# Patient Record
Sex: Female | Born: 1965 | Race: Black or African American | Hispanic: No | Marital: Married | Smoking: Never smoker
Health system: Southern US, Community
[De-identification: ages and names within clinical notes are randomized; demographics above are authoritative.]

## PROBLEM LIST (undated history)

## (undated) DIAGNOSIS — D219 Benign neoplasm of connective and other soft tissue, unspecified: Secondary | ICD-10-CM

## (undated) DIAGNOSIS — E119 Type 2 diabetes mellitus without complications: Secondary | ICD-10-CM

## (undated) DIAGNOSIS — D649 Anemia, unspecified: Secondary | ICD-10-CM

## (undated) DIAGNOSIS — M1990 Unspecified osteoarthritis, unspecified site: Secondary | ICD-10-CM

## (undated) DIAGNOSIS — E669 Obesity, unspecified: Secondary | ICD-10-CM

## (undated) HISTORY — PX: CHOLECYSTECTOMY: SHX55

## (undated) HISTORY — PX: TUBAL LIGATION: SHX77

## (undated) HISTORY — PX: WISDOM TOOTH EXTRACTION: SHX21

## (undated) HISTORY — DX: Obesity, unspecified: E66.9

---

## 1997-07-17 ENCOUNTER — Ambulatory Visit (HOSPITAL_COMMUNITY): Admission: RE | Admit: 1997-07-17 | Discharge: 1997-07-17 | Payer: Self-pay | Admitting: Family Medicine

## 1998-10-02 ENCOUNTER — Emergency Department (HOSPITAL_COMMUNITY): Admission: EM | Admit: 1998-10-02 | Discharge: 1998-10-02 | Payer: Self-pay | Admitting: Emergency Medicine

## 1998-10-02 ENCOUNTER — Encounter: Payer: Self-pay | Admitting: Emergency Medicine

## 1999-09-05 ENCOUNTER — Encounter: Payer: Self-pay | Admitting: *Deleted

## 1999-09-05 ENCOUNTER — Encounter: Admission: RE | Admit: 1999-09-05 | Discharge: 1999-09-05 | Payer: Self-pay | Admitting: *Deleted

## 2000-01-01 ENCOUNTER — Encounter: Payer: Self-pay | Admitting: *Deleted

## 2000-01-01 ENCOUNTER — Ambulatory Visit (HOSPITAL_COMMUNITY): Admission: RE | Admit: 2000-01-01 | Discharge: 2000-01-01 | Payer: Self-pay | Admitting: *Deleted

## 2000-05-12 ENCOUNTER — Inpatient Hospital Stay: Admission: AD | Admit: 2000-05-12 | Discharge: 2000-05-12 | Payer: Self-pay | Admitting: *Deleted

## 2000-05-13 ENCOUNTER — Inpatient Hospital Stay (HOSPITAL_COMMUNITY): Admission: AD | Admit: 2000-05-13 | Discharge: 2000-05-16 | Payer: Self-pay | Admitting: *Deleted

## 2000-05-13 ENCOUNTER — Encounter (INDEPENDENT_AMBULATORY_CARE_PROVIDER_SITE_OTHER): Payer: Self-pay | Admitting: Specialist

## 2000-05-18 ENCOUNTER — Observation Stay (HOSPITAL_COMMUNITY): Admission: AD | Admit: 2000-05-18 | Discharge: 2000-05-19 | Payer: Self-pay | Admitting: *Deleted

## 2002-07-12 ENCOUNTER — Other Ambulatory Visit: Admission: RE | Admit: 2002-07-12 | Discharge: 2002-07-12 | Payer: Self-pay | Admitting: Obstetrics and Gynecology

## 2004-08-22 ENCOUNTER — Other Ambulatory Visit: Admission: RE | Admit: 2004-08-22 | Discharge: 2004-08-22 | Payer: Self-pay | Admitting: Obstetrics and Gynecology

## 2005-10-20 ENCOUNTER — Other Ambulatory Visit: Admission: RE | Admit: 2005-10-20 | Discharge: 2005-10-20 | Payer: Self-pay | Admitting: Obstetrics and Gynecology

## 2006-03-04 ENCOUNTER — Ambulatory Visit: Payer: Self-pay | Admitting: Family Medicine

## 2006-06-16 ENCOUNTER — Ambulatory Visit: Payer: Self-pay | Admitting: Family Medicine

## 2006-06-17 ENCOUNTER — Ambulatory Visit: Payer: Self-pay | Admitting: Family Medicine

## 2006-08-18 ENCOUNTER — Ambulatory Visit: Payer: Self-pay | Admitting: Family Medicine

## 2006-08-19 ENCOUNTER — Ambulatory Visit (HOSPITAL_COMMUNITY): Admission: RE | Admit: 2006-08-19 | Discharge: 2006-08-19 | Payer: Self-pay | Admitting: Family Medicine

## 2006-09-13 ENCOUNTER — Ambulatory Visit: Payer: Self-pay | Admitting: Family Medicine

## 2006-11-27 DIAGNOSIS — Z9189 Other specified personal risk factors, not elsewhere classified: Secondary | ICD-10-CM | POA: Insufficient documentation

## 2006-11-27 DIAGNOSIS — Z87448 Personal history of other diseases of urinary system: Secondary | ICD-10-CM

## 2006-11-27 HISTORY — DX: Other specified personal risk factors, not elsewhere classified: Z91.89

## 2006-12-07 ENCOUNTER — Ambulatory Visit: Payer: Self-pay | Admitting: Family Medicine

## 2007-02-10 ENCOUNTER — Ambulatory Visit: Payer: Self-pay | Admitting: Family Medicine

## 2007-02-10 ENCOUNTER — Ambulatory Visit: Payer: Self-pay | Admitting: *Deleted

## 2007-02-10 DIAGNOSIS — R300 Dysuria: Secondary | ICD-10-CM | POA: Insufficient documentation

## 2007-02-10 HISTORY — DX: Dysuria: R30.0

## 2007-02-10 LAB — CONVERTED CEMR LAB
Bilirubin Urine: NEGATIVE
Blood in Urine, dipstick: NEGATIVE
Glucose, Urine, Semiquant: NEGATIVE
Protein, U semiquant: NEGATIVE

## 2007-02-11 ENCOUNTER — Encounter (INDEPENDENT_AMBULATORY_CARE_PROVIDER_SITE_OTHER): Payer: Self-pay | Admitting: Nurse Practitioner

## 2007-02-28 ENCOUNTER — Ambulatory Visit: Payer: Self-pay | Admitting: Family Medicine

## 2007-03-01 ENCOUNTER — Encounter (INDEPENDENT_AMBULATORY_CARE_PROVIDER_SITE_OTHER): Payer: Self-pay | Admitting: Family Medicine

## 2007-03-01 ENCOUNTER — Ambulatory Visit: Payer: Self-pay | Admitting: Family Medicine

## 2007-05-23 ENCOUNTER — Ambulatory Visit: Payer: Self-pay | Admitting: Family Medicine

## 2007-07-05 ENCOUNTER — Ambulatory Visit: Payer: Self-pay | Admitting: Family Medicine

## 2007-07-05 DIAGNOSIS — S93409A Sprain of unspecified ligament of unspecified ankle, initial encounter: Secondary | ICD-10-CM

## 2007-07-05 DIAGNOSIS — M25569 Pain in unspecified knee: Secondary | ICD-10-CM

## 2007-07-05 HISTORY — DX: Sprain of unspecified ligament of unspecified ankle, initial encounter: S93.409A

## 2007-07-05 HISTORY — DX: Pain in unspecified knee: M25.569

## 2007-07-18 ENCOUNTER — Telehealth (INDEPENDENT_AMBULATORY_CARE_PROVIDER_SITE_OTHER): Payer: Self-pay | Admitting: *Deleted

## 2007-07-26 ENCOUNTER — Telehealth (INDEPENDENT_AMBULATORY_CARE_PROVIDER_SITE_OTHER): Payer: Self-pay | Admitting: *Deleted

## 2007-07-26 ENCOUNTER — Ambulatory Visit (HOSPITAL_COMMUNITY): Admission: RE | Admit: 2007-07-26 | Discharge: 2007-07-26 | Payer: Self-pay | Admitting: Family Medicine

## 2007-08-01 ENCOUNTER — Telehealth (INDEPENDENT_AMBULATORY_CARE_PROVIDER_SITE_OTHER): Payer: Self-pay | Admitting: *Deleted

## 2007-08-02 ENCOUNTER — Ambulatory Visit (HOSPITAL_COMMUNITY): Admission: RE | Admit: 2007-08-02 | Discharge: 2007-08-02 | Payer: Self-pay | Admitting: Family Medicine

## 2007-08-10 ENCOUNTER — Ambulatory Visit: Payer: Self-pay | Admitting: Family Medicine

## 2007-08-10 LAB — CONVERTED CEMR LAB
Bilirubin Urine: NEGATIVE
Blood in Urine, dipstick: NEGATIVE
Glucose, Urine, Semiquant: NEGATIVE
Protein, U semiquant: NEGATIVE

## 2007-08-15 ENCOUNTER — Ambulatory Visit: Payer: Self-pay | Admitting: Family Medicine

## 2007-11-07 ENCOUNTER — Ambulatory Visit: Payer: Self-pay | Admitting: Family Medicine

## 2007-11-07 LAB — CONVERTED CEMR LAB: Beta hcg, urine, semiquantitative: NEGATIVE

## 2008-01-27 ENCOUNTER — Ambulatory Visit: Payer: Self-pay | Admitting: Family Medicine

## 2008-03-20 ENCOUNTER — Ambulatory Visit: Payer: Self-pay | Admitting: Family Medicine

## 2008-03-20 DIAGNOSIS — M5380 Other specified dorsopathies, site unspecified: Secondary | ICD-10-CM

## 2008-03-20 HISTORY — DX: Other specified dorsopathies, site unspecified: M53.80

## 2008-04-20 ENCOUNTER — Ambulatory Visit: Payer: Self-pay | Admitting: Family Medicine

## 2008-05-28 ENCOUNTER — Encounter (INDEPENDENT_AMBULATORY_CARE_PROVIDER_SITE_OTHER): Payer: Self-pay | Admitting: Family Medicine

## 2008-05-28 ENCOUNTER — Ambulatory Visit: Payer: Self-pay | Admitting: Family Medicine

## 2008-05-28 LAB — CONVERTED CEMR LAB
ALT: 17 units/L (ref 0–35)
AST: 14 units/L (ref 0–37)
Blood in Urine, dipstick: NEGATIVE
Chloride: 108 meq/L (ref 96–112)
Creatinine, Ser: 0.69 mg/dL (ref 0.40–1.20)
Eosinophils Absolute: 0.1 10*3/uL (ref 0.0–0.7)
Glucose, Urine, Semiquant: NEGATIVE
HCT: 38.9 % (ref 36.0–46.0)
Ketones, urine, test strip: NEGATIVE
Lymphocytes Relative: 41 % (ref 12–46)
Lymphs Abs: 2.5 10*3/uL (ref 0.7–4.0)
MCV: 88.6 fL (ref 78.0–100.0)
Monocytes Relative: 7 % (ref 3–12)
Neutrophils Relative %: 50 % (ref 43–77)
Potassium: 4.5 meq/L (ref 3.5–5.3)
RBC: 4.39 M/uL (ref 3.87–5.11)
TSH: 0.803 microintl units/mL (ref 0.350–4.50)
Total Bilirubin: 0.5 mg/dL (ref 0.3–1.2)
Total CHOL/HDL Ratio: 3.1
Total Protein: 7.3 g/dL (ref 6.0–8.3)
VLDL: 16 mg/dL (ref 0–40)
WBC Urine, dipstick: NEGATIVE
WBC: 6 10*3/uL (ref 4.0–10.5)

## 2008-06-01 ENCOUNTER — Ambulatory Visit (HOSPITAL_COMMUNITY): Admission: RE | Admit: 2008-06-01 | Discharge: 2008-06-01 | Payer: Self-pay | Admitting: Family Medicine

## 2008-06-15 ENCOUNTER — Encounter (INDEPENDENT_AMBULATORY_CARE_PROVIDER_SITE_OTHER): Payer: Self-pay | Admitting: *Deleted

## 2008-07-05 ENCOUNTER — Telehealth (INDEPENDENT_AMBULATORY_CARE_PROVIDER_SITE_OTHER): Payer: Self-pay | Admitting: *Deleted

## 2008-07-13 ENCOUNTER — Ambulatory Visit: Payer: Self-pay | Admitting: Family Medicine

## 2008-10-03 ENCOUNTER — Telehealth (INDEPENDENT_AMBULATORY_CARE_PROVIDER_SITE_OTHER): Payer: Self-pay | Admitting: Family Medicine

## 2008-10-09 ENCOUNTER — Ambulatory Visit: Payer: Self-pay | Admitting: Physician Assistant

## 2008-10-09 LAB — CONVERTED CEMR LAB: Chlamydia, DNA Probe: NEGATIVE

## 2008-10-10 ENCOUNTER — Encounter: Payer: Self-pay | Admitting: Physician Assistant

## 2008-11-08 ENCOUNTER — Telehealth: Payer: Self-pay | Admitting: Physician Assistant

## 2008-11-13 ENCOUNTER — Ambulatory Visit: Payer: Self-pay | Admitting: Physician Assistant

## 2008-11-13 DIAGNOSIS — N61 Mastitis without abscess: Secondary | ICD-10-CM | POA: Insufficient documentation

## 2008-11-13 HISTORY — DX: Mastitis without abscess: N61.0

## 2008-11-14 ENCOUNTER — Encounter: Admission: RE | Admit: 2008-11-14 | Discharge: 2008-11-14 | Payer: Self-pay | Admitting: Internal Medicine

## 2008-12-26 ENCOUNTER — Ambulatory Visit: Payer: Self-pay | Admitting: Obstetrics and Gynecology

## 2008-12-27 ENCOUNTER — Encounter: Payer: Self-pay | Admitting: Physician Assistant

## 2008-12-28 ENCOUNTER — Ambulatory Visit (HOSPITAL_COMMUNITY): Admission: RE | Admit: 2008-12-28 | Discharge: 2008-12-28 | Payer: Self-pay | Admitting: Obstetrics and Gynecology

## 2009-01-16 ENCOUNTER — Ambulatory Visit: Payer: Self-pay | Admitting: Obstetrics and Gynecology

## 2009-01-24 ENCOUNTER — Ambulatory Visit: Payer: Self-pay | Admitting: Nurse Practitioner

## 2009-01-24 ENCOUNTER — Encounter: Payer: Self-pay | Admitting: Physician Assistant

## 2009-01-25 ENCOUNTER — Ambulatory Visit: Payer: Self-pay | Admitting: Physician Assistant

## 2009-02-01 ENCOUNTER — Telehealth: Payer: Self-pay | Admitting: Physician Assistant

## 2009-02-07 ENCOUNTER — Encounter: Payer: Self-pay | Admitting: Physician Assistant

## 2009-02-07 DIAGNOSIS — N920 Excessive and frequent menstruation with regular cycle: Secondary | ICD-10-CM | POA: Insufficient documentation

## 2009-02-15 ENCOUNTER — Telehealth: Payer: Self-pay | Admitting: Physician Assistant

## 2009-04-18 ENCOUNTER — Ambulatory Visit: Payer: Self-pay | Admitting: Physician Assistant

## 2009-05-01 ENCOUNTER — Ambulatory Visit: Payer: Self-pay | Admitting: Physician Assistant

## 2009-05-01 LAB — CONVERTED CEMR LAB
Blood in Urine, dipstick: NEGATIVE
Ketones, urine, test strip: NEGATIVE
Nitrite: NEGATIVE
Protein, U semiquant: NEGATIVE
Urobilinogen, UA: 0.2

## 2009-05-02 ENCOUNTER — Encounter: Payer: Self-pay | Admitting: Physician Assistant

## 2009-05-02 LAB — CONVERTED CEMR LAB: Chlamydia, Swab/Urine, PCR: NEGATIVE

## 2009-05-03 ENCOUNTER — Encounter (INDEPENDENT_AMBULATORY_CARE_PROVIDER_SITE_OTHER): Payer: Self-pay | Admitting: *Deleted

## 2009-05-28 ENCOUNTER — Ambulatory Visit: Payer: Self-pay | Admitting: Physician Assistant

## 2009-05-28 LAB — CONVERTED CEMR LAB
AST: 12 units/L (ref 0–37)
Albumin: 4.5 g/dL (ref 3.5–5.2)
BUN: 11 mg/dL (ref 6–23)
Bilirubin Urine: NEGATIVE
Calcium: 9.5 mg/dL (ref 8.4–10.5)
Chloride: 107 meq/L (ref 96–112)
HDL: 45 mg/dL (ref >39)
Ketones, urine, test strip: NEGATIVE
Potassium: 4.3 meq/L (ref 3.5–5.3)
Urobilinogen, UA: 0.2
pH: 5.5

## 2009-05-29 ENCOUNTER — Encounter: Payer: Self-pay | Admitting: Physician Assistant

## 2009-06-06 ENCOUNTER — Telehealth: Payer: Self-pay | Admitting: Physician Assistant

## 2009-06-07 ENCOUNTER — Encounter: Admission: RE | Admit: 2009-06-07 | Discharge: 2009-06-07 | Payer: Self-pay | Admitting: Internal Medicine

## 2009-07-10 ENCOUNTER — Ambulatory Visit: Payer: Self-pay | Admitting: Internal Medicine

## 2009-07-24 ENCOUNTER — Ambulatory Visit: Payer: Self-pay | Admitting: Physician Assistant

## 2009-07-24 DIAGNOSIS — N390 Urinary tract infection, site not specified: Secondary | ICD-10-CM

## 2009-07-24 HISTORY — DX: Urinary tract infection, site not specified: N39.0

## 2009-07-24 LAB — CONVERTED CEMR LAB
Glucose, Urine, Semiquant: NEGATIVE
Ketones, urine, test strip: NEGATIVE
Nitrite: POSITIVE

## 2009-07-25 ENCOUNTER — Encounter: Payer: Self-pay | Admitting: Physician Assistant

## 2009-09-30 ENCOUNTER — Telehealth: Payer: Self-pay | Admitting: Physician Assistant

## 2009-10-01 ENCOUNTER — Ambulatory Visit: Payer: Self-pay | Admitting: Physician Assistant

## 2009-12-23 ENCOUNTER — Telehealth: Payer: Self-pay | Admitting: Physician Assistant

## 2009-12-23 ENCOUNTER — Encounter: Payer: Self-pay | Admitting: Physician Assistant

## 2010-02-13 ENCOUNTER — Inpatient Hospital Stay (HOSPITAL_COMMUNITY)
Admission: AD | Admit: 2010-02-13 | Discharge: 2010-02-13 | Payer: Self-pay | Source: Home / Self Care | Admitting: Obstetrics & Gynecology

## 2010-02-14 ENCOUNTER — Emergency Department (HOSPITAL_COMMUNITY)
Admission: EM | Admit: 2010-02-14 | Discharge: 2010-02-14 | Payer: Self-pay | Source: Home / Self Care | Admitting: Family Medicine

## 2010-02-14 ENCOUNTER — Telehealth (INDEPENDENT_AMBULATORY_CARE_PROVIDER_SITE_OTHER): Payer: Self-pay | Admitting: Nurse Practitioner

## 2010-02-20 ENCOUNTER — Emergency Department (HOSPITAL_COMMUNITY)
Admission: EM | Admit: 2010-02-20 | Discharge: 2010-02-20 | Payer: Self-pay | Source: Home / Self Care | Admitting: Family Medicine

## 2010-03-12 ENCOUNTER — Ambulatory Visit: Payer: Self-pay | Admitting: Internal Medicine

## 2010-03-12 DIAGNOSIS — H8309 Labyrinthitis, unspecified ear: Secondary | ICD-10-CM

## 2010-03-12 DIAGNOSIS — J019 Acute sinusitis, unspecified: Secondary | ICD-10-CM

## 2010-03-12 HISTORY — DX: Acute sinusitis, unspecified: J01.90

## 2010-03-12 HISTORY — DX: Labyrinthitis, unspecified ear: H83.09

## 2010-03-12 LAB — CONVERTED CEMR LAB: Blood Glucose, Fingerstick: 80

## 2010-04-02 ENCOUNTER — Telehealth (INDEPENDENT_AMBULATORY_CARE_PROVIDER_SITE_OTHER): Payer: Self-pay | Admitting: Internal Medicine

## 2010-04-29 ENCOUNTER — Telehealth (INDEPENDENT_AMBULATORY_CARE_PROVIDER_SITE_OTHER): Payer: Self-pay | Admitting: Internal Medicine

## 2010-05-08 NOTE — Assessment & Plan Note (Signed)
Summary: depo injection//gk  Nurse Visit   Allergies: No Known Drug Allergies  Medication Administration  Injection # 1:    Medication: Depo-Provera 150mg     Diagnosis: CONTRACEPTIVE MANAGEMENT (ICD-V25.09)    Route: IM    Site: R deltoid    Comments: Next Depo due 12/23/09    Patient tolerated injection without complications    Given by: Gaylyn Cheers RN (October 01, 2009 11:47 AM)  Orders Added: 1)  Depo-Provera 150mg  [J1055] 2)  Admin of Therapeutic Inj  intramuscular or subcutaneous [96372] 3)  Est. Patient Level I [16109]   Medication Administration  Injection # 1:    Medication: Depo-Provera 150mg     Diagnosis: CONTRACEPTIVE MANAGEMENT (ICD-V25.09)    Route: IM    Site: R deltoid    Comments: Next Depo due 12/23/09    Patient tolerated injection without complications    Given by: Gaylyn Cheers RN (October 01, 2009 11:47 AM)  Orders Added: 1)  Depo-Provera 150mg  [J1055] 2)  Admin of Therapeutic Inj  intramuscular or subcutaneous [96372] 3)  Est. Patient Level I [60454]

## 2010-05-08 NOTE — Assessment & Plan Note (Signed)
Summary: CPP EXAM//GK   Vital Signs:  Patient profile:   45 year old female Menstrual status:  irregular Height:      62 inches Weight:      228 pounds BMI:     41.85 Temp:     98.3 degrees F oral Pulse rate:   74 / minute Pulse rhythm:   regular Resp:     18 per minute BP sitting:   130 / 83  (left arm) Cuff size:   large  Vitals Entered By: Armenia Shannon (May 28, 2009 10:34 AM) CC: CPP.... Is Patient Diabetic? No Pain Assessment Patient in pain? no       Does patient need assistance? Functional Status Self care Ambulation Normal   CC:  CPP.....  History of Present Illness: Here for CPP. Never had abnormal pap. Notes some spotting today.  Still taking Depo and due for f/u injection in April. Actually, in talking with patient, she is actually bleeding like she is having a full period. She has a long h/o R pelvic pain with ovulation and menses.  Did see gyn clinic in 12/2008 and ultrasound was notable for a small fibroid and normal ovaries.  She takes ibuprofen for the pain and sometimes flexeril.  She discussed with gyn possibly getting an IUD placed as she is a little skeptical of continuing the depo-provera. No discharge, odor or itching. Had a cyst like lesion in 11/2008 and had mammo and u/s.  F/u screening mammo rec. this mo. to get back on yearly schedule. No fhx of breast cancer or ovarian cancer. Pt. not taking calcium.   Problems Prior to Update: 1)  Menorrhagia  (ICD-626.2) 2)  Contraceptive Management  (ICD-V25.09) 3)  Abscess, Breast, Right  (ICD-611.0) 4)  Screening For Mlig Neop, Breast, Nos  (ICD-V76.10) 5)  Screening For Malignant Neoplasm, Cervix  (ICD-V76.2) 6)  Examination, Routine Medical  (ICD-V70.0) 7)  Muscle Spasm, Back  (ICD-724.8) 8)  Ankle Sprain, Left  (ICD-845.00) 9)  Knee Pain, Left  (ICD-719.46) 10)  Contraceptive Management  (ICD-V25.09) 11)  Dysuria  (ICD-788.1) 12)  Abdominal Pain, Hx of  (ICD-V15.89) 13)  Uti's, Hx of   (ICD-V13.00)  Current Medications (verified): 1)  Depo-Provera 150 Mg/ml Im Susp (Medroxyprogesterone Acetate) .... Im Every 3mos 2)  Ibuprofen 800 Mg Tabs (Ibuprofen) .... Take One Tablet By Mouth Every 6-8 Hours As Needed For Pain. Take With Food 3)  Flexeril 10 Mg Tabs (Cyclobenzaprine Hcl) .... Take 1 Tablet By Mouth Every 8 Hours As Needed Muscle Pain 4)  Naprosyn 500 Mg Tabs (Naproxen) .... Take 1 Tablet By Mouth Two Times A Day As Needed For Pain 5)  Diflucan 150 Mg Tabs (Fluconazole) .Marland Kitchen.. 1 By Mouth X 1  Allergies (verified): No Known Drug Allergies  Past History:  Past Medical History: Last updated: 07/05/2007 H/O SMALL FIBROID ABDOMINAL PAIN, HX OF (ICD-V15.89) UTI'S, HX OF (ICD-V13.00)  Past Surgical History: Last updated: 11/27/2006 Caesarean section x3 Tubal ligation 2002  Family History: Reviewed history from 05/28/2008 and no changes required. Mother living thyroid,DM,HTN,OA,glaucoma,RA Father living DM   No family h/o breast/cervical/ovarian/uterine/colon cancer.  Social History: Reviewed history from 05/28/2008 and no changes required. Occupation:childcare Married Never Smoked Alcohol use-yes,rarely. Drug use-no  Review of Systems  The patient denies fever, weight loss, chest pain, syncope, dyspnea on exertion, prolonged cough, hemoptysis, melena, hematochezia, severe indigestion/heartburn, hematuria, and depression.         See HPI.  All other systems reviewed and neg.  Physical Exam  General:  alert, well-developed, and well-nourished.   Head:  atraumatic.   Eyes:  pupils equal, pupils round, pupils reactive to light, and no injection.   Ears:  R ear normal and L ear normal.   Nose:  no external deformity.   Mouth:  pharynx pink and moist, no erythema, and no exudates.   Neck:  supple, no thyromegaly, no carotid bruits, and no cervical lymphadenopathy.   Breasts:  skin/areolae normal, no masses, no abnormal thickening, no nipple discharge, no  tenderness, and no adenopathy.   Lungs:  normal breath sounds, no crackles, and no wheezes.   Heart:  normal rate, regular rhythm, and no murmur.   Abdomen:  soft, non-tender, normal bowel sounds, and no hepatomegaly.   Rectal:  deferred Genitalia:  normal introitus, no external lesions, and mucosa pink and moist.   sanguinous material noted coming from cervical os no lesions or external bleeding  Msk:  normal ROM.   Pulses:  DP/PT 2+ bilat Extremities:  no edema  Neurologic:  alert & oriented X3 and cranial nerves II-XII intact.   Skin:  turgor normal.   Psych:  normally interactive.     Impression & Recommendations:  Problem # 1:  EXAMINATION, ROUTINE MEDICAL (ICD-V70.0) patient refuses flu shot had Td PHQ9=1   Orders: T-Urinalysis (47829-56213) T-HIV Antibody  (Reflex) [id number]) T-Syphilis Test (RPR) [phone]) T-Comprehensive Metabolic Panel ([phone]) T-Lipid Profile ([id number])  Problem # 2:  SCREENING FOR MLIG NEOP, BREAST, NOS (ICD-V76.10)  Orders: Mammogram (Screening) (Mammo)  Problem # 3:  SCREENING FOR MALIGNANT NEOPLASM, CERVIX (ICD-V76.2) actually having a period today will bring back for pap smear  Orders: T-HIV Antibody  (Reflex) ([id number]) T-Syphilis Test (RPR) (32951-88416)  Problem # 4:  MENORRHAGIA (ICD-626.2) continues on depo but would like to try the IUD when she can afford it suspect some breakthrough bleeding from depo today   Her updated medication list for this problem includes:    Depo-provera 150 Mg/ml Im Susp (Medroxyprogesterone acetate) ..... Im every 3mos  Problem # 5:  DYSURIA (ICD-788.1) resolved with antibx therapy of note, her cultrue was neg no further w/u for now  blood noted on u/a today 2/2 menses  Complete Medication List: 1)  Depo-provera 150 Mg/ml Im Susp (Medroxyprogesterone acetate) .... Im every 3mos 2)  Ibuprofen 800 Mg Tabs (Ibuprofen) .... Take one tablet by mouth every 6-8 hours as needed  for pain. take with food 3)  Flexeril 10 Mg Tabs (Cyclobenzaprine hcl) .... Take 1 tablet by mouth every 8 hours as needed muscle pain 4)  Naprosyn 500 Mg Tabs (Naproxen) .... Take 1 tablet by mouth two times a day as needed for pain 5)  Diflucan 150 Mg Tabs (Fluconazole) .Marland Kitchen.. 1 by mouth x 1  Patient Instructions: 1)  Schedule pap smear in 2 weeks with Kiasha Bellin.   2)  Return sooner or go to the Emergency Room if your bleeding persisits or gets worse. 3)  Take 400-600 mg of Ibuprofen (Advil, Motrin) with food every 4-6 hours as needed  for relief of pain or comfort of fever.   Laboratory Results   Urine Tests  Date/Time Received: May 28, 2009 11:02 AM   Routine Urinalysis   Glucose: negative   (Normal Range: Negative) Bilirubin: negative   (Normal Range: Negative) Ketone: negative   (Normal Range: Negative) Spec. Gravity: 1.015   (Normal Range: 1.003-1.035) Blood: large   (Normal Range: Negative) pH: 5.5   (Normal Range: 5.0-8.0) Protein: trace   (Normal Range: Negative) Urobilinogen:  0.2   (Normal Range: 0-1) Nitrite: negative   (Normal Range: Negative) Leukocyte Esterace: negative   (Normal Range: Negative)    Date/Time Received: May 28, 2009 12:45 PM  Date/Time Reported: May 28, 2009 12:45 PM   Other Tests  Rapid HIV: negative   Laboratory Results   Urine Tests    Routine Urinalysis   Glucose: negative   (Normal Range: Negative) Bilirubin: negative   (Normal Range: Negative) Ketone: negative   (Normal Range: Negative) Spec. Gravity: 1.015   (Normal Range: 1.003-1.035) Blood: large   (Normal Range: Negative) pH: 5.5   (Normal Range: 5.0-8.0) Protein: trace   (Normal Range: Negative) Urobilinogen: 0.2   (Normal Range: 0-1) Nitrite: negative   (Normal Range: Negative) Leukocyte Esterace: negative   (Normal Range: Negative)      Other Tests  Rapid HIV: negative

## 2010-05-08 NOTE — Letter (Signed)
Summary: *HSN Results Follow up  [facility]  [address] [place], [place] [address]   Phone: [phone]  Fax: [phone]      05/29/2009   SKYE PLAMONDON Hauth [address] [place], [place]  [address]   Dear  Ms. Rachella Boyar,                            ____S.Drinkard,FNP   ____D. Gore,FNP       ____B. McPherson,MD   ____V. Rankins,MD    ____E. Mulberry,MD    ____N. Daphine Deutscher, FNP  ____D. Reche Dixon, MD    ____K. Philipp Deputy, MD    __x__S. Alben Spittle, PA-C     This letter is to inform you that your recent test(s):  _______Pap Smear    ___x____Lab Test     _______X-ray    ___x____ is within acceptable limits  _______ requires a medication change  _______ requires a follow-up lab visit  _______ requires a follow-up visit with your provider   Comments:       _________________________________________________________ If you have any questions, please contact our office                     Sincerely,  Tereso Newcomer PA-C [facility]

## 2010-05-08 NOTE — Assessment & Plan Note (Signed)
Summary: Patient Not Seen   Allergies: No Known Drug Allergies   Complete Medication List: 1)  Depo-provera 150 Mg/ml Im Susp (Medroxyprogesterone acetate) .... Im every 3mos 2)  Ibuprofen 800 Mg Tabs (Ibuprofen) .... Take one tablet by mouth every 6-8 hours as needed for pain. take with food 3)  Flexeril 10 Mg Tabs (Cyclobenzaprine hcl) .... Take 1 tablet by mouth every 8 hours as needed muscle pain 4)  Naprosyn 500 Mg Tabs (Naproxen) .... Take 1 tablet by mouth two times a day as needed for pain 5)  Diflucan 150 Mg Tabs (Fluconazole) .Marland Kitchen.. 1 by mouth x 1

## 2010-05-08 NOTE — Progress Notes (Signed)
Summary: DEPO RX  Phone Note Call from Patient   Caller: Patient Reason for Call: Refill Medication Summary of Call: PT NEEDS A RX FOR DEPO SEND TO [facility] Initial call taken by: Oscar La,  December 23, 2009 2:45 PM  Follow-up for Phone Call        forward to provider Follow-up by: Armenia Shannon,  December 23, 2009 3:46 PM  Additional Follow-up for Phone Call Additional follow up Details #1::        pt aware Additional Follow-up by: Armenia Shannon,  December 24, 2009 2:22 PM    Prescriptions: DEPO-PROVERA 150 MG/ML IM SUSP (MEDROXYPROGESTERONE ACETATE) IM every 3mos  #1 dose x 0   Entered and Authorized by:   Tereso Newcomer PA-C   Signed by:   Tereso Newcomer PA-C on 12/23/2009   Method used:   Faxed to ...       [facility] (retail)       [address] [place], [place]  [address]       Ph: [phone]       Fax: [phone]   RxID:   [id number]

## 2010-05-08 NOTE — Assessment & Plan Note (Signed)
Summary: //MC   Vital Signs:  Patient profile:   45 year old female Menstrual status:  Depo Weight:      235.13 pounds Temp:     98.5 degrees F oral Pulse rate:   78 / minute Pulse rhythm:   regular Resp:     18 per minute BP sitting:   128 / 96  (left arm) Cuff size:   regular  Vitals Entered By: Hale Drone CMA (March 12, 2010 12:35 PM) CC: [facility] urgent care f/u on vertigo. Pt. felt light headed, had blurry vsion, and felt nauseas. Pt had missed her depo w/us but was given the depo shot on 02/13/10.  Is Patient Diabetic? No Pain Assessment Patient in pain? no      CBG Result 80 CBG Device ID B Non Fasting  Does patient need assistance? Functional Status Self care Ambulation Normal LMP - Character: depo     Menstrual Status Depo Last PAP Result NEGATIVE FOR INTRAEPITHELIAL LESIONS OR MALIGNANCY.   Primary Care Provider:  Tereso Newcomer PA-C  CC:  [facility] urgent care f/u on vertigo. Pt. felt light headed, had blurry vsion, and and felt nauseas. Pt had missed her depo w/us but was given the depo shot on 02/13/10. Marland Kitchen  History of Present Illness: 1.  Vertigo: Started 02/13/10.  Noted after waking that morning.  Was seen 11/11 at [facility].   Went back on the 17th and was apparently doing better, but still with enough symptoms that felt needed to be seen.  Pt. later states that went to [facility] on the 10th as she thought her dizziness was related to her fibroids.  They apparently did blood work there that was normal.  No other testing done otherwise.  Pt. states she is doing much better now, but still "hazy" at this point.  Has some discomfort in nuchal area--when rubs there, gets swimmy headed.  Eyes feel heavy, sore and tired.  Has had some nasal congestion in last couple of days.  Has had a bit of a sore throat as well.  Ears have been buzzing, but no pain or discomfort.  Still nauseated, especially when looking in any direction or bending over.  No history of  allergies.  Has never had a sinus infection.  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  Current Medications (verified): 1)  Depo-Provera 150 Mg/ml Im Susp (Medroxyprogesterone Acetate) .... Im Every 3mos 2)  Ibuprofen 800 Mg Tabs (Ibuprofen) .... Take One Tablet By Mouth Every 6-8 Hours As Needed For Pain. Take With Food 3)  Flexeril 10 Mg Tabs (Cyclobenzaprine Hcl) .... Take 1 Tablet By Mouth Every 8 Hours As Needed Muscle Pain 4)  Naprosyn 500 Mg Tabs (Naproxen) .... Take 1 Tablet By Mouth Two Times A Day As Needed For Pain 5)  Diflucan 150 Mg Tabs (Fluconazole) .Marland Kitchen.. 1 By Mouth X 1  Allergies (verified): No Known Drug Allergies  Physical Exam  General:  NAD Head:  Tender over frontal and more so maxillary sinuses Eyes:  Eyelids puffy, no conjunctival injection.  EOMI, PERRL, no nystagmus with EOMI Ears:  External ear exam shows no significant lesions or deformities.  Otoscopic examination reveals clear canals, tympanic membranes are intact bilaterally without bulging, retraction, inflammation or discharge. Hearing is grossly normal bilaterally. Nose:  clear discharge mild turbinate swelling. Mouth:  pharynx pink and moist.   Neck:  No deformities, masses, or tenderness noted. Lungs:  Normal respiratory effort, chest  expands symmetrically. Lungs are clear to auscultation, no crackles or wheezes. Heart:  Normal rate and regular rhythm. S1 and S2 normal without gallop, murmur, click, rub or other extra sounds. Neurologic:  alert & oriented X3, cranial nerves II-XII intact, strength normal in all extremities, gait normal, DTRs symmetrical and normal, finger-to-nose normal, and Romberg negative.     Impression & Recommendations:  Problem # 1:  SINUSITIS, ACUTE (ICD-461.9) Also start Xyzal. Saline nasal spray Call if no improment. Her updated medication list for this problem includes:    Azithromycin 250 Mg Tabs (Azithromycin) .Marland Kitchen... 2 tabs by  mouth today, then 1 tab by mouth daily for 4 more days.  Problem # 2:  LABYRINTHITIS, ACUTE (ICD-386.30) Resolving.  Complete Medication List: 1)  Depo-provera 150 Mg/ml Im Susp (Medroxyprogesterone acetate) .... Im every 3mos 2)  Ibuprofen 800 Mg Tabs (Ibuprofen) .... Take one tablet by mouth every 6-8 hours as needed for pain. take with food 3)  Flexeril 10 Mg Tabs (Cyclobenzaprine hcl) .... Take 1 tablet by mouth every 8 hours as needed muscle pain 4)  Naprosyn 500 Mg Tabs (Naproxen) .... Take 1 tablet by mouth two times a day as needed for pain 5)  Diflucan 150 Mg Tabs (Fluconazole) .Marland Kitchen.. 1 by mouth x 1 6)  Azithromycin 250 Mg Tabs (Azithromycin) .... 2 tabs by mouth today, then 1 tab by mouth daily for 4 more days. 7)  Xyzal 5 Mg Tabs (Levocetirizine dihydrochloride) .Marland Kitchen.. 1 tab by mouth daily as needed for drainage  Other Orders: Flu Vaccine 37yrs + (60454) Admin 1st Vaccine (09811) Capillary Blood Glucose/CBG (91478)  Patient Instructions: 1)  Call if no improvement with treatment of sinuses. 2)  Saline nasal spray as needed Prescriptions: XYZAL 5 MG TABS (LEVOCETIRIZINE DIHYDROCHLORIDE) 1 tab by mouth daily as needed for drainage  #30 x 1   Entered and Authorized by:   Julieanne Manson MD   Signed by:   Julieanne Manson MD on 03/12/2010   Method used:   Faxed to ...       [facility] (retail)       [address] [place], [place]  [address]       Ph: [phone]       Fax: [phone]   RxID:   [id number] AZITHROMYCIN 250 MG TABS (AZITHROMYCIN) 2 tabs by mouth today, then 1 tab by mouth daily for 4 more days.  #6 x 0   Entered and Authorized by:   Julieanne Manson MD   Signed by:   Julieanne Manson MD on 03/12/2010   Method used:   Faxed to ...       [facility] (retail)       [address] [place], [place]  [address]       Ph: [phone]       Fax: [phone]    RxID:   [id number]    Orders Added: 1)  Flu Vaccine 31yrs + [90658] 2)  Admin 1st Vaccine [90471] 3)  Capillary Blood Glucose/CBG [82948] 4)  Est. Patient Level III [57322]   Immunizations Administered:  Influenza Vaccine # 1:    Vaccine Type: Fluvax 3+    Site: left deltoid    Mfr: GlaxoSmithKline    Dose: 0.5 ml    Route: IM    Given by: Hale Drone CMA    Exp. Date: 10/04/2010    Lot #:  [id number]    VIS given: 10/29/09 version given March 12, 2010.  Flu Vaccine Consent Questions:    Do you have a history of severe allergic reactions to this vaccine? no    Any prior history of allergic reactions to egg and/or gelatin? no    Do you have a sensitivity to the preservative Thimersol? no    Do you have a past history of Guillan-Barre Syndrome? no    Do you currently have an acute febrile illness? no    Have you ever had a severe reaction to latex? no    Vaccine information given and explained to patient? yes    Are you currently pregnant? no   Immunizations Administered:  Influenza Vaccine # 1:    Vaccine Type: Fluvax 3+    Site: left deltoid    Mfr: GlaxoSmithKline    Dose: 0.5 ml    Route: IM    Given by: Hale Drone CMA    Exp. Date: 10/04/2010    Lot #: [id number]    VIS given: 10/29/09 version given March 12, 2010.

## 2010-05-08 NOTE — Assessment & Plan Note (Signed)
Summary: BURNS WHEN URINATING///KT   Vital Signs:  Patient profile:   45 year old female Menstrual status:  irregular Height:      62 inches Weight:      235 pounds BMI:     43.14 Temp:     98.3 degrees F oral Pulse rate:   82 / minute Pulse rhythm:   regular Resp:     18 per minute BP sitting:   136 / 86  (left arm) Cuff size:   large  Vitals Entered By: Armenia Shannon (May 01, 2009 4:07 PM) CC: pt says it burns when she urinates... Is Patient Diabetic? No Pain Assessment Patient in pain? no       Does patient need assistance? Functional Status Self care Ambulation Normal   CC:  pt says it burns when she urinates....  History of Present Illness: Here for dysuria.  Notes symptoms since late last week. + urgency + frequency No vaginal discharge or burning or itching. Sexually active with one partner. No fevers or chills. Has some low back pain.  No vomiting or diarrhea.   Current Medications (verified): 1)  Depo-Provera 150 Mg/ml Im Susp (Medroxyprogesterone Acetate) .... Im Every 3mos 2)  Ibuprofen 800 Mg Tabs (Ibuprofen) .... Take One Tablet By Mouth Every 6-8 Hours As Needed For Pain. Take With Food 3)  Flexeril 10 Mg Tabs (Cyclobenzaprine Hcl) .... Take 1 Tablet By Mouth Every 8 Hours As Needed Muscle Pain 4)  Naprosyn 500 Mg Tabs (Naproxen) .... Take 1 Tablet By Mouth Two Times A Day As Needed For Pain 5)  Diflucan 150 Mg Tabs (Fluconazole) .Marland Kitchen.. 1 By Mouth X 1  Allergies (verified): No Known Drug Allergies  Physical Exam  General:  alert, well-developed, and well-nourished.   Head:  normocephalic and atraumatic.   Lungs:  normal breath sounds.   Heart:  normal rate and regular rhythm.   Abdomen:  soft and non-tender.   Msk:  no CVA tend Neurologic:  alert & oriented X3 and cranial nerves II-XII intact.   Psych:  normally interactive.     Impression & Recommendations:  Problem # 1:  DYSURIA (ICD-788.1)  suspect UTI but urine is clean has all  the symptoms . . . will go ahead and treat push fluids send urine for GC/Chlam as well as culture tx with cipro 250 two times a day x 3 days return if no improvement ? interstitial cystitis . . . consider this dx if no improvement  Orders: T-Culture, Urine ([id number]) T-GC Probe, urine [phone]) T-Chlamydia  Probe, urine [phone])  Her updated medication list for this problem includes:    Cipro 250 Mg Tabs (Ciprofloxacin hcl) .Marland Kitchen... Take 1 tablet by mouth two times a day  Complete Medication List: 1)  Depo-provera 150 Mg/ml Im Susp (Medroxyprogesterone acetate) .... Im every 3mos 2)  Ibuprofen 800 Mg Tabs (Ibuprofen) .... Take one tablet by mouth every 6-8 hours as needed for pain. take with food 3)  Flexeril 10 Mg Tabs (Cyclobenzaprine hcl) .... Take 1 tablet by mouth every 8 hours as needed muscle pain 4)  Naprosyn 500 Mg Tabs (Naproxen) .... Take 1 tablet by mouth two times a day as needed for pain 5)  Diflucan 150 Mg Tabs (Fluconazole) .Marland Kitchen.. 1 by mouth x 1 6)  Cipro 250 Mg Tabs (Ciprofloxacin hcl) .... Take 1 tablet by mouth two times a day  Patient Instructions: 1)  Drink plenty of fluids. 2)  Take tylenol for pain or fever. 3)  Call or go to the emergency room if you develop a fever of 101 or higher. 4)  Follow up if symptoms do not resolve or worsen. Prescriptions: CIPRO 250 MG TABS (CIPROFLOXACIN HCL) Take 1 tablet by mouth two times a day  #6 x 0   Entered and Authorized by:   Tereso Newcomer PA-C   Signed by:   Tereso Newcomer PA-C on 05/01/2009   Method used:   Print then Give to Patient   RxID:   [id number]   Laboratory Results   Urine Tests  Date/Time Received: May 01, 2009 4:12 PM   Routine Urinalysis   Glucose: negative   (Normal Range: Negative) Bilirubin: negative   (Normal Range: Negative) Ketone: negative   (Normal Range: Negative) Spec. Gravity: 1.010   (Normal Range: 1.003-1.035) Blood: negative   (Normal Range: Negative) pH: 6.0    (Normal Range: 5.0-8.0) Protein: negative   (Normal Range: Negative) Urobilinogen: 0.2   (Normal Range: 0-1) Nitrite: negative   (Normal Range: Negative) Leukocyte Esterace: negative   (Normal Range: Negative)

## 2010-05-08 NOTE — Progress Notes (Signed)
Summary: need medicine for vertigo symptoms  Phone Note Call from Patient   Summary of Call: pt is having problems with  vertigo. Pt. felt light headed, had blurry vision, and felt nauseas.  she whants to know if you can give her medicine please call her at [phone] she got it before Initial call taken by: Domenic Polite,  April 02, 2010 1:02 PM  Follow-up for Phone Call        Left message on answering machine for pt. to return call.  Dutch Quint RN  April 02, 2010 5:54 PM  Last episode resolved after antibiotics and sinus medication.  Felt a lot better, but episode restarted yesterday morning.  Stopped taking sinus medication after she started feeling better.  Went she bent over several times yesterday, had feeling of dizziness and lost her balance, head was swimming, had to pull over, feeling nauseated.  Took sinus medication again last night and this morning, but still had  vertigo symptoms last night in bed, bot only when she turned over on her right side.  Eyes feel foggy.   Back of neck and shoulders again feel achy. [facility] had given her meclizine before, but she only felt better after she had taken antibiotic and the sinus medication.  Wants to know what she should take.   Follow-up by: Dutch Quint RN,  April 03, 2010 9:39 AM  Additional Follow-up for Phone Call Additional follow up Details #1::        Did she get the antihistamine from pharmacy?  Is she taking.   Will also start nasal steroids  Julieanne Manson MD  April 04, 2010 6:37 PM   Left message on answering machine for pt. to return call.  Dutch Quint RN  April 08, 2010 2:49 PM  Left message on answering machine for pt. to return call.  Dutch Quint RN  April 09, 2010 12:46 PM  When symptoms started up again, she started taking the antihistamine again.  Notified of new Rx and instructions for use.  Dutch Quint RN  April 09, 2010 4:03 PM     New/Updated Medications: FLUTICASONE PROPIONATE 50 MCG/ACT  SUSP (FLUTICASONE PROPIONATE) 2 sprays each nostril daily Prescriptions: FLUTICASONE PROPIONATE 50 MCG/ACT SUSP (FLUTICASONE PROPIONATE) 2 sprays each nostril daily  #1 x 6   Entered and Authorized by:   Julieanne Manson MD   Signed by:   Julieanne Manson MD on 04/04/2010   Method used:   Faxed to ...       [facility]retail)       [address] [place], [place]  [address]       Ph: [phone]       Fax: [phone]   RxID:   [id number]

## 2010-05-08 NOTE — Progress Notes (Signed)
  Phone Note Call from Patient Call back at [facility] Phone [phone]   Summary of Call: the pt is aware that she needs to go tomorrow for a mammogram exam at 4:30 but she doesn't know where will be.  Please call her back at [phone].  Initial call taken by: Manon Hilding,  June 06, 2009 4:51 PM  Follow-up for Phone Call        pt is aware  Follow-up by: Armenia Shannon,  June 06, 2009 4:54 PM

## 2010-05-08 NOTE — Progress Notes (Signed)
Summary: VERY DIZZY  Phone Note Call from Patient Call back at Home Phone [phone]   Reason for Call: Talk to Nurse Summary of Call: WEAVER PT. SHE HAS BEEN EXPERIENCING DIZZINESS SINCE YESTERDAY. SHE WENT TO [facility] FOR THAT ALONG WITH HAVING NASUEA AND VOMITING, THEY CHECKED HER BP AND IT WAS FINE AND OTHER TEST AND THEY CAME OUT OK. SHE SAYS THAT SHE IS STILL VERY DIZZY, IT FEELS LIKE THE ROOM IS SPINNING AND THE BACK OF HER HEAD FEELS LIKE IT HAS PRESSURE IN IT. Initial call taken by: Leodis Rains,  February 14, 2010 2:41 PM  Follow-up for Phone Call        Micah Flesher to [facility] because her side was hurting, they did pelvic and transvaginal US which showed a cyst which has not grown, no change.  Turning her head and changes in posture make her dizzy.  Denies nasal congestion or sinus pressure, no drainage.  States ears feel like they're "wide open" -- no pain.  Eyes feel like they're quivering when she sits up.  Has had an eye exam within the last six months.  [facility] did orthostatic BPs, BPs were fine.  Doesn't know what to do -- will go back to [facility] if no other choice.  [facility] only gave Rx for pain and nausea -- no diagnosis.  Wants to be seen or to find out what this is --- unable to hold head up.  No numbness or tingling, just "woozy" whenever she sits up.    Follow-up by: Dutch Quint RN,  February 14, 2010 4:30 PM  Additional Follow-up for Phone Call Additional follow up Details #1::        ? early morning appt just became available on Monday - put pt there Additional Follow-up by: Lehman Prom FNP,  February 14, 2010 4:43 PM    Additional Follow-up for Phone Call Additional follow up Details #2::    Advised to drink plenty of fluids, take antihistamines/decongestants for any allergy symptoms, change position slowly, do not drive at all.  Offered appointment for Monday, cannot wait since she can't function.  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  No appt. available Mon. AM -- appt. made for  Mon. PM -- will cancel if not needed.  Dutch Quint RN  February 14, 2010 4:51 PM

## 2010-05-08 NOTE — Progress Notes (Signed)
Summary: DEPO INJECTION APPT 6-28  Phone Note Call from Patient   Reason for Call: Refill Medication Summary of Call: PT HAVE AN APPT TOMORROW 10-01-09 @ 8:50AM DEPO INJECTION . [facility] CALL HEALTH DEPT AND THEY DONT HAVE HER [facility] BACK @ [phone] [facility]  Initial call taken by: Cheryll Dessert,  September 30, 2009 4:37 PM  Follow-up for Phone Call        pt is aware to just come here in get depo from [place] this time since we still have some inhouse Follow-up by: Armenia Shannon,  October 01, 2009 8:07 AM

## 2010-05-08 NOTE — Assessment & Plan Note (Signed)
Summary: HURTS WHEN SHE PEES///KT   Vital Signs:  Patient profile:   45 year old female Menstrual status:  irregular Height:      62 inches Weight:      226 pounds BMI:     41.49 Temp:     98.7 degrees F oral Pulse rate:   83 / minute Pulse rhythm:   regular Resp:     20 per minute BP sitting:   111 / 75  (left arm) Cuff size:   large  Vitals Entered By: Armenia Shannon (July 24, 2009 11:53 AM) CC: bladder issues... pt says when she uses the bathroom it hurts-burning... pt has not took OTC meds... Is Patient Diabetic? No Pain Assessment Patient in pain? no       Does patient need assistance? Functional Status Self care Ambulation Normal   Primary Care Provider:  Tereso Newcomer PA-C  CC:  bladder issues... pt says when she uses the bathroom it hurts-burning... pt has not took OTC meds....  History of Present Illness: Here for possible UTI. Started having dysuria and urgency and frequency 2 days ago.  Had intercourse day before symptoms started.  Notes some right flank pain.  Fever of 99 yesterday.  THought she saw some discharge yest.  It was white.  No vomiting or lightheadedness.  Current Medications (verified): 1)  Depo-Provera 150 Mg/ml Im Susp (Medroxyprogesterone Acetate) .... Im Every 3mos 2)  Ibuprofen 800 Mg Tabs (Ibuprofen) .... Take One Tablet By Mouth Every 6-8 Hours As Needed For Pain. Take With Food 3)  Flexeril 10 Mg Tabs (Cyclobenzaprine Hcl) .... Take 1 Tablet By Mouth Every 8 Hours As Needed Muscle Pain 4)  Naprosyn 500 Mg Tabs (Naproxen) .... Take 1 Tablet By Mouth Two Times A Day As Needed For Pain 5)  Diflucan 150 Mg Tabs (Fluconazole) .Marland Kitchen.. 1 By Mouth X 1  Allergies (verified): No Known Drug Allergies  Physical Exam  General:  alert, well-developed, and well-nourished.   Head:  normocephalic and atraumatic.   Neck:  supple.   Lungs:  normal breath sounds.   Heart:  normal rate and regular rhythm.   Abdomen:  soft and non-tender.   Msk:  slight  CVA tenderness on right with percussion Neurologic:  alert & oriented X3 and cranial nerves II-XII intact.   Psych:  normally interactive.     Impression & Recommendations:  Problem # 1:  UTI (ICD-599.0)  with some fever and flank pain, will treat for a full 7 days  Her updated medication list for this problem includes:    Bactrim Ds 800-160 Mg Tabs (Sulfamethoxazole-trimethoprim) .Marland Kitchen... Take 1 tablet by mouth two times a day until all gone    Pyridium 100 Mg Tabs (Phenazopyridine hcl) .Marland Kitchen... Take 1 tablet by mouth three times a day for 2 days  Orders: T-Culture, Urine ([id number])  Complete Medication List: 1)  Depo-provera 150 Mg/ml Im Susp (Medroxyprogesterone acetate) .... Im every 3mos 2)  Ibuprofen 800 Mg Tabs (Ibuprofen) .... Take one tablet by mouth every 6-8 hours as needed for pain. take with food 3)  Flexeril 10 Mg Tabs (Cyclobenzaprine hcl) .... Take 1 tablet by mouth every 8 hours as needed muscle pain 4)  Naprosyn 500 Mg Tabs (Naproxen) .... Take 1 tablet by mouth two times a day as needed for pain 5)  Diflucan 150 Mg Tabs (Fluconazole) .Marland Kitchen.. 1 by mouth x 1 6)  Bactrim Ds 800-160 Mg Tabs (Sulfamethoxazole-trimethoprim) .... Take 1 tablet by mouth two times a day  until all gone 7)  Pyridium 100 Mg Tabs (Phenazopyridine hcl) .... Take 1 tablet by mouth three times a day for 2 days  Patient Instructions: 1)  Schedule follow up appointment with Lorin Picket for pap smear only. Prescriptions: PYRIDIUM 100 MG TABS (PHENAZOPYRIDINE HCL) Take 1 tablet by mouth three times a day for 2 days  #6 x 0   Entered and Authorized by:   Tereso Newcomer PA-C   Signed by:   Tereso Newcomer PA-C on 07/24/2009   Method used:   Print then Give to Patient   RxID:   [id number] BACTRIM DS 800-160 MG TABS (SULFAMETHOXAZOLE-TRIMETHOPRIM) Take 1 tablet by mouth two times a day until all gone  #14 x 0   Entered and Authorized by:   Tereso Newcomer PA-C   Signed by:   Tereso Newcomer PA-C on 07/24/2009    Method used:   Print then Give to Patient   RxID:   [id number]   Laboratory Results   Urine Tests  Date/Time Received: July 24, 2009 12:13 PM   Routine Urinalysis   Glucose: negative   (Normal Range: Negative) Bilirubin: negative   (Normal Range: Negative) Ketone: negative   (Normal Range: Negative) Spec. Gravity: 1.025   (Normal Range: 1.003-1.035) Blood: trace-lysed   (Normal Range: Negative) pH: 5.5   (Normal Range: 5.0-8.0) Protein: trace   (Normal Range: Negative) Urobilinogen: 0.2   (Normal Range: 0-1) Nitrite: positive   (Normal Range: Negative) Leukocyte Esterace: large   (Normal Range: Negative)

## 2010-05-08 NOTE — Letter (Signed)
Summary: *HSN Results Follow up  [facility]  [address] [place], [place] [address]   Phone: [phone]  Fax: [phone]      05/03/2009   Shirley Savage Clauson [address] [place], [place]  [address]   Dear  Ms. Flora Sutphin,                            ____S.Drinkard,FNP   ____D. Gore,FNP       ____B. McPherson,MD   ____V. Rankins,MD    ____E. Mulberry,MD    ____N. Daphine Deutscher, FNP  ____D. Reche Dixon, MD    ____K. Philipp Deputy, MD    ____Other     This letter is to inform you that your recent test(s):  ____X___Pap Smear    ___X____Lab Test     _______X-ray    ___X____ is within acceptable limits  _______ requires a medication change  _______ requires a follow-up lab visit  _______ requires a follow-up visit with your provider   Comments:       _________________________________________________________ If you have any questions, please contact our office                     Sincerely,  Armenia Shannon [facility]

## 2010-05-09 NOTE — Progress Notes (Signed)
Summary: Office Visit//DEPRESSION SCREENING  Office Visit//DEPRESSION SCREENING   Imported By: Arta Bruce 07/23/2009 15:56:29  _____________________________________________________________________  External Attachment:    Type:   Image     Comment:   External Document

## 2010-05-12 ENCOUNTER — Encounter (INDEPENDENT_AMBULATORY_CARE_PROVIDER_SITE_OTHER): Payer: Self-pay | Admitting: Internal Medicine

## 2010-05-14 NOTE — Progress Notes (Signed)
Summary: Needs Depo refill  Phone Note Call from Patient   Summary of Call: pt called to get appt for depo... pt says her last depo was in nov.... pt says she is on depo for the cyst.... pt wants to know can she come in for appt for depo... pt would like like a tuesday appt Initial call taken by: Arm[place] Shannon,  April 29, 2010 3:55 PM  Follow-up for Phone Call        The nurse's notations in her Ov from 03/2010 stated she had obtained the depo 02/13/10. If we can get documentation (may be at [facility]) that she did receive it and she is in time for next, she can have a refill for 1 more shot--document in meds and call into her pharmacy. She will need a CPP before can get another--please schedule  Follow-up by: Julieanne Manson MD,  April 30, 2010 1:39 PM  Additional Follow-up for Phone Call Additional follow up Details #1::        Per ED records, received depo on 02/13/10.  F/U appt. for depo scheduled 05/13/10.  No available CPP appts. through March schedule -- will call back to schedule CPP. Advised that she will not be able to get more depo refills until CPP completed.  Verbalized understanding.   Pt. advised to check with [facility] for refill availability - depo refill completed.  Dutch Quint RN  May 07, 2010 4:54 PM     Prescriptions: DEPO-PROVERA 150 MG/ML IM SUSP (MEDROXYPROGESTERONE ACETATE) IM every 3mos  #1 dose x 0   Entered by:   Dutch Quint RN   Authorized by:   Julieanne Manson MD   Signed by:   Dutch Quint RN on 05/07/2010   Method used:   Faxed to ...       [facility])       [address] [place], [place]  [address]       Ph: [phone]       Fax: [phone]   RxID:   [id number]

## 2010-05-22 NOTE — Assessment & Plan Note (Signed)
Summary: Depo  Nurse Visit   Allergies: No Known Drug Allergies  Medication Administration  Injection # 1:    Medication: Depo-Provera 150mg     Diagnosis: CONTRACEPTIVE MANAGEMENT (ICD-V25.09)    Route: IM    Site: R deltoid    Exp Date: 01/2013    Lot #: [id number]    Mfr: [employer]    Comments: NDC [id number]; next depo due August 04, 2010    Patient tolerated injection without complications    Given by: Gaylyn Cheers RN (May 12, 2010 12:05 PM)  Orders Added: 1)  Admin of patients own med IM/SQ [96372M] 2)  Est. Patient Nurse visit [09003]   Medication Administration  Injection # 1:    Medication: Depo-Provera 150mg     Diagnosis: CONTRACEPTIVE MANAGEMENT (ICD-V25.09)    Route: IM    Site: R deltoid    Exp Date: 01/2013    Lot #: [id number]    Mfr: [employer]    Comments: NDC [id number]; next depo due August 04, 2010    Patient tolerated injection without complications    Given by: Gaylyn Cheers RN (May 12, 2010 12:05 PM)  Orders Added: 1)  Admin of patients own med IM/SQ [96372M] 2)  Est. Patient Nurse visit [09003]

## 2010-06-17 LAB — WET PREP, GENITAL: Yeast Wet Prep HPF POC: NONE SEEN

## 2010-06-17 LAB — URINALYSIS, ROUTINE W REFLEX MICROSCOPIC
Leukocytes, UA: NEGATIVE
Nitrite: NEGATIVE
Specific Gravity, Urine: 1.01 (ref 1.005–1.030)
pH: 8 (ref 5.0–8.0)

## 2010-06-17 LAB — URINE MICROSCOPIC-ADD ON

## 2010-06-17 LAB — CBC
Platelets: 201 10*3/uL (ref 150–400)
RBC: 4.05 MIL/uL (ref 3.87–5.11)
WBC: 6.3 10*3/uL (ref 4.0–10.5)

## 2010-06-17 LAB — SAMPLE TO BLOOD BANK

## 2010-07-24 ENCOUNTER — Other Ambulatory Visit (HOSPITAL_COMMUNITY): Payer: Self-pay | Admitting: Internal Medicine

## 2010-07-24 DIAGNOSIS — R1011 Right upper quadrant pain: Secondary | ICD-10-CM

## 2010-07-25 ENCOUNTER — Ambulatory Visit (HOSPITAL_COMMUNITY)
Admission: RE | Admit: 2010-07-25 | Discharge: 2010-07-25 | Disposition: A | Discharge status: Home / Self Care | Payer: Self-pay | Source: Ambulatory Visit | Attending: Internal Medicine | Admitting: Internal Medicine

## 2010-07-25 DIAGNOSIS — R1011 Right upper quadrant pain: Secondary | ICD-10-CM

## 2010-07-25 DIAGNOSIS — R109 Unspecified abdominal pain: Secondary | ICD-10-CM | POA: Insufficient documentation

## 2010-07-25 DIAGNOSIS — Q619 Cystic kidney disease, unspecified: Secondary | ICD-10-CM | POA: Insufficient documentation

## 2010-07-25 DIAGNOSIS — K8020 Calculus of gallbladder without cholecystitis without obstruction: Secondary | ICD-10-CM | POA: Insufficient documentation

## 2010-08-01 ENCOUNTER — Other Ambulatory Visit: Payer: Self-pay | Admitting: Internal Medicine

## 2010-08-01 ENCOUNTER — Other Ambulatory Visit (HOSPITAL_COMMUNITY): Payer: Self-pay | Admitting: Internal Medicine

## 2010-08-01 DIAGNOSIS — Z1231 Encounter for screening mammogram for malignant neoplasm of breast: Secondary | ICD-10-CM

## 2010-08-12 ENCOUNTER — Ambulatory Visit (HOSPITAL_COMMUNITY): Payer: Self-pay

## 2010-08-12 ENCOUNTER — Ambulatory Visit (HOSPITAL_COMMUNITY)
Admission: RE | Admit: 2010-08-12 | Discharge: 2010-08-12 | Disposition: A | Discharge status: Home / Self Care | Payer: Self-pay | Source: Ambulatory Visit | Attending: Internal Medicine | Admitting: Internal Medicine

## 2010-08-12 DIAGNOSIS — Z1231 Encounter for screening mammogram for malignant neoplasm of breast: Secondary | ICD-10-CM | POA: Insufficient documentation

## 2010-08-19 ENCOUNTER — Encounter (HOSPITAL_COMMUNITY)
Admission: RE | Admit: 2010-08-19 | Discharge: 2010-08-19 | Disposition: A | Discharge status: Home / Self Care | Payer: Self-pay | Source: Ambulatory Visit | Attending: General Surgery | Admitting: General Surgery

## 2010-08-19 LAB — DIFFERENTIAL
Eosinophils Absolute: 0.2 10*3/uL (ref 0.0–0.7)
Eosinophils Relative: 2 % (ref 0–5)
Lymphs Abs: 2.7 10*3/uL (ref 0.7–4.0)
Monocytes Relative: 5 % (ref 3–12)
Neutrophils Relative %: 54 % (ref 43–77)

## 2010-08-19 LAB — SURGICAL PCR SCREEN
MRSA, PCR: NEGATIVE
Staphylococcus aureus: POSITIVE — AB

## 2010-08-19 LAB — CBC
HCT: 37.2 % (ref 36.0–46.0)
MCH: 29.7 pg (ref 26.0–34.0)
MCV: 84.4 fL (ref 78.0–100.0)
Platelets: 205 10*3/uL (ref 150–400)
RBC: 4.41 MIL/uL (ref 3.87–5.11)

## 2010-08-19 LAB — COMPREHENSIVE METABOLIC PANEL
Alkaline Phosphatase: 72 U/L (ref 39–117)
BUN: 12 mg/dL (ref 6–23)
CO2: 26 mEq/L (ref 19–32)
GFR calc non Af Amer: >60 mL/min (ref >60)
Glucose, Bld: 81 mg/dL (ref 70–99)
Potassium: 4.3 mEq/L (ref 3.5–5.1)
Total Bilirubin: 0.5 mg/dL (ref 0.3–1.2)
Total Protein: 7.2 g/dL (ref 6.0–8.3)

## 2010-08-22 ENCOUNTER — Ambulatory Visit (HOSPITAL_COMMUNITY)
Admission: RE | Admit: 2010-08-22 | Discharge: 2010-08-22 | Disposition: A | Discharge status: Home / Self Care | Payer: Self-pay | Source: Ambulatory Visit | Attending: General Surgery | Admitting: General Surgery

## 2010-08-22 ENCOUNTER — Other Ambulatory Visit: Payer: Self-pay | Admitting: General Surgery

## 2010-08-22 DIAGNOSIS — E669 Obesity, unspecified: Secondary | ICD-10-CM | POA: Insufficient documentation

## 2010-08-22 DIAGNOSIS — K8010 Calculus of gallbladder with chronic cholecystitis without obstruction: Secondary | ICD-10-CM | POA: Insufficient documentation

## 2010-08-22 NOTE — H&P (Signed)
[facility]  Patient:    Shirley Savage, Shirley Savage                      MRN: [id number] Adm. Date:  [id number] Attending:  Deniece Ree                         History and Physical  CHIEF COMPLAINT:              The patient is a 45 year old gravida 3 para 2 female at term, being admitted for repeat cesarean section and bilateral tubal ligation.  HISTORY OF PRESENT ILLNESS:   The patient has had two previous cesarean sections, the first one for fetal distress and the second because of a large-for-gestational-age infant.  The patient is very adamant about having repeat cesarean section.  She is also desirous of permanent sterilization. The sterilization procedure was explained to the patient to her and her husbands satisfaction and all their questions were answered.  They understand this procedure is intended to be permanent; however, cannot be guaranteed.  PAST MEDICAL HISTORY:         Significant in that the patient will not receive any blood or blood products.  She has left a power of attorney in the chart stating same.  She denies any other problems.  PHYSICAL EXAMINATION:  GENERAL:                      Physical examination revealed a well-developed, well-nourished, obese, gravid female in no acute distress.  HEENT:                        Within normal limits.  NECK:                         Supple.  BREAST:                       Without masses, tenderness, or discharge.  LUNGS:                        Clear to auscultation and percussion.  HEART:                        Normal sinus rhythm without murmurs, rubs, or gallops.  ABDOMEN:                      Obese and term, with good fetal heart tones in the left lower quadrant.  EXTREMITIES:                  Within normal limits.  NEUROLOGIC:                   Within normal limits.  PELVIC:                       Examination not done.  DIAGNOSES:                    1. Intrauterine pregnancy at  term.                               2. Previous cesarean section x 2.  3. Multiparity, desires permanent sterilization.  PLAN:                         The plan is for repeat cesarean section and bilateral tubal ligation. DD:  05/13/00 TD:  05/13/00 Job: [id number]

## 2010-08-22 NOTE — Discharge Summary (Signed)
[facility]  Patient:    Shirley Savage, Shirley Savage                      MRN: [id number] Adm. Date:  [id number] Disc. Date: [id number] Attending:  Deniece Ree                           Discharge Summary  SUMMARY:                      The patient is a 45 year old gravida 3, para 2 who was at term and who was admitted for repeat cesarean section and a bilateral tubal ligation.  Patient is also a Air traffic controller Witness and indicated and signed off on that she did not want to receive any blood or blood products.  This information was placed in the chart.  On the day of admission patient underwent a repeat cesarean section and a bilateral tubal ligation. The patient tolerated this procedure very well without any problems at which time she had a viable female infant with Apgars of 9 and 9.  Postoperatively she did very well without any complications and was discharged on the third postoperative day.  She was instructed on the possible complications and care following this type of surgery.  She was told to return to my office in four weeks for followup evaluation or to call me prior to that time should any problems arise.DD:  06/16/00 TD:  06/16/00 Job: [id number]

## 2010-08-22 NOTE — Op Note (Signed)
[facility]  Patient:    Shirley Savage, Shirley Savage                      MRN: [id number] Proc. Date: 05/13/00 Adm. Date:  [id number] Attending:  Deniece Ree                           Operative Report  PREOPERATIVE DIAGNOSES:       1. Intrauterine pregnancy at term, previous                                  cesarean sections x 2.                               2. Multiparity, desires a permanent                                  sterilization.  POSTOPERATIVE DIAGNOSES:      1. Intrauterine pregnancy at term, previous                                  cesarean sections x 2.                               2. Multiparity, desires a permanent                                  sterilization.                               3. Viable female infant with an Apgar of 9 and 9                                  and weighing 8 pounds 6 ounces.  OPERATION:                    1. Repeat cesarean section.                               2. Bilateral tubal ligation.  SURGEON:                      Deniece Ree, M.D.  ANESTHESIA:                   Spinal by Dr. Pamalee Leyden.  PEDIATRICS:                   Teaching Service.  ESTIMATED BLOOD LOSS:         500 cc.  DRAINS:                       A Foley is left to straight drainage.  DISPOSITION:                  The patient tolerated the procedure well and returned to the  recovery room in satisfactory condition.  DESCRIPTION OF PROCEDURE:     The patient was taken to the operating room, prepped and draped in the usual fashion for a repeat cesarean section. A low Pfannenstiel incision was made following the course of the previous incisions. This was carried down to the fascia at which time the fascia was instantly excised the extent of the incision. A midline was identified and the rectus muscle separated. The abdominoperitoneum was then entered in a vertical fashion using Metzenbaum scissors. Some adhesions were present which  were bluntly and sharply dissected away. The lower uterine segment was then scored toward the round ligaments, entered in the midline, and bluntly dissected open. The right hand was introduced and because of continuous difficulty in delivering the head, a Mityvac was then utilized and the infant head was delivered. The nares and the pharynx were then sucked out with a suction bulb. Complete delivery was then carried out without any problems, and the infant then turned over to the pediatricians who were in attendance. Cord blood was then obtained following which the placenta, as well as all products of conception, was then manually removed from the uterine cavity. The Pitocin, as well IV antibiotics, were then begun. The myometrium was closed using a #1 chromic in a running locking stitch followed by an imbricated stitch, again using #1 chromic. At this point, hemostasis was present. The left tube was then identified, grasped, and followed out until the fimbriated end could be identified. It was then knuckled up and, utilizing a 0 plain catgut, ligated in routine fashion for the modified Pomeroy procedure. This was done likewise on the opposite side. Both segments of tube were then labeled and sent to pathology. Both tubal stump areas were then cauterized with the use of a cautery. Again, hemostasis was present. Sponge and needle count was correct x 2. The abdominal peritoneum was then closed using 2-0 chromic in a running stitch followed by closure of the fascia using #1 Dexon in a running stitch. Skin was closed with 4-0 Vicryl in a subcuticular stitch. The procedure was then terminated. The patient tolerated the procedure well and returned to the recovery room in satisfactory condition. DD:  05/13/00 TD:  05/13/00 Job: [id number]

## 2010-09-03 NOTE — Op Note (Signed)
Shirley Savage, Shirley Savage               ACCOUNT NO.:  [id number]  MEDICAL RECORD NO.:  [id number]           PATIENT TYPE:  O  LOCATION:  [facility]                         FACILITY:  [facility]  PHYSICIAN:  Cherylynn Ridges, M.D.    DATE OF BIRTH:  05-Oct-1965  DATE OF PROCEDURE:  08/22/2010 DATE OF DISCHARGE:  08/22/2010                              OPERATIVE REPORT   PREOPERATIVE DIAGNOSIS:  Symptomatic cholelithiasis.  POSTOPERATIVE DIAGNOSIS:  Cholelithiasis with chronic cholecystitis,  PROCEDURE:  Laparoscopic cholecystectomy.  SURGEON:  Marta Lamas. Lindie Spruce, MD  ASSISTANT:  Wilmon Arms. Tsuei, MD  ANESTHESIA:  General endotracheal.  ESTIMATED BLOOD LOSS:  Less than 20 mL.  No complications.  CONDITION:  Stable.  FINDINGS:  Very small cystic duct, large gallstones, normal liver function tests.  Evidence of chronic and subacute cholecystitis.  INDICATIONS FOR OPERATION:  The patient is a 45 year old with symptomatic gallstones who comes in now for an elective laparoscopic cholecystectomy.  OPERATION:  The patient was taken to the operating room and placed on the table in supine position.  After an adequate general endotracheal anesthetic was administered, she was prepped and draped in the usual sterile manner exposing the umbilical area and the entire abdomen.  After proper time-out was performed identifying the patient and procedure to be performed, a supraumbilical midline incision was made using a #15 blade.  Using appendiceal retractors, we identified the fascia.  We incised the fascia above the umbilicus using an 15 blade and then grabbed the edges with Kocher clamp.  We then bluntly dissected down into the peritoneal cavity using a Kelly clamp.  A pursestring suture of 0 Vicryl was passed around the fascial opening, was secured in a Hasson cannula.  We subsequently had to enlarge the fascial opening postoperatively or at the end of the case for passage of large stones.  With the  Hasson cannula in place, we insufflated carbon dioxide gas up to a maximal intra-abdominal pressure of 50 mmHg.  We then passed two right upper quadrant 5-mm cannulas and a subxiphoid 5-mm cannula under direct vision.  With all cannulas in place, the patient was placed in reverse Trendelenburg and the left side was tilted down.  A grasper was passed onto the dome of the gallbladder and we retracted it towards the anterior abdominal wall as we dissected off a number of omental adhesions to the body and infundibulum of the gallbladder.  We were eventually able to identify and isolate the infundibulum.  There was a large vascular structure coming over from the hilum towards the gallbladder which turned out to be about a 5-mm cystic artery vessel. We dissected this free and just inferolateral to that was another tubular structure which turned out to be the cystic duct.  We did dissect out the peritoneum overlying the triangle of Calot.  We identified these structures.  We placed a clip along the gallbladder side of what we thought was the cystic duct and opened the area using laparoscopic scissors.  The lumen of the cystic duct was less than 2 mm in size and very difficult to try to cannulate, therefore after multiple  attempts to open it up with a hook dissector we failed to do so and decided not to do a cholangiogram as the patient had normal liver function tests and there was a risk that we would avulse the distal cystic duct.  We then clipped the distal cystic duct x3, transected it. We clipped the artery proximally and distally and transected it and then we dissected out the gallbladder from its bed with minimal amount of difficulty.  We used an Endocatch bag to retrieve it from the supraumbilical site; however, as mentioned previously we had the larger fascia superiorly in order to pass the stone.  Once we were able to do so, we did have to place extra sutures in the fascial site in  order to close it off from leakage.  Once the 0 Vicryl was used to close the fascia, we inspected the gallbladder bed, but there was minimal bleeding, no bile staining.  We irrigated with saline solution and aspirated all fluid.  All fluid and gas were aspirated from above the liver.  We removed all cannulas.  Marcaine 0.25% with epi was injected at all sites.  The supraumbilical skin site was closed using running subcuticular stitch of 4-0 Monocryl.  All other incisions were closed with Dermabond, Steri- Strips, and Tegaderm and that was also used to reinforce the supraumbilical site.  All counts were correct.     Cherylynn Ridges, M.D.     JOW/MEDQ  D:  08/22/2010  T:  08/23/2010  Job:  [id number]  Electronically Signed by Jimmye Norman M.D. on 09/03/2010 05:11:09 PM

## 2010-11-05 ENCOUNTER — Other Ambulatory Visit: Payer: Self-pay | Admitting: Internal Medicine

## 2010-11-05 DIAGNOSIS — R109 Unspecified abdominal pain: Secondary | ICD-10-CM

## 2010-11-07 ENCOUNTER — Ambulatory Visit (HOSPITAL_COMMUNITY)
Admission: RE | Admit: 2010-11-07 | Discharge: 2010-11-07 | Disposition: A | Discharge status: Home / Self Care | Payer: Self-pay | Source: Ambulatory Visit | Attending: Internal Medicine | Admitting: Internal Medicine

## 2010-11-07 DIAGNOSIS — K5730 Diverticulosis of large intestine without perforation or abscess without bleeding: Secondary | ICD-10-CM | POA: Insufficient documentation

## 2010-11-07 DIAGNOSIS — K439 Ventral hernia without obstruction or gangrene: Secondary | ICD-10-CM | POA: Insufficient documentation

## 2010-11-07 DIAGNOSIS — R109 Unspecified abdominal pain: Secondary | ICD-10-CM

## 2010-11-17 ENCOUNTER — Other Ambulatory Visit (HOSPITAL_COMMUNITY): Payer: Self-pay

## 2011-04-27 IMAGING — MG MM DIGITAL SCREENING
4 series · 4 of 4 positions shown · non-contrast
Comparison: Prior studies.

DG SCREEN MAMMOGRAM BILATERAL
Bilateral CC and MLO view(s) were taken.
at The [REDACTED].

DIGITAL SCREENING MAMMOGRAM WITH CAD:

[R CC]
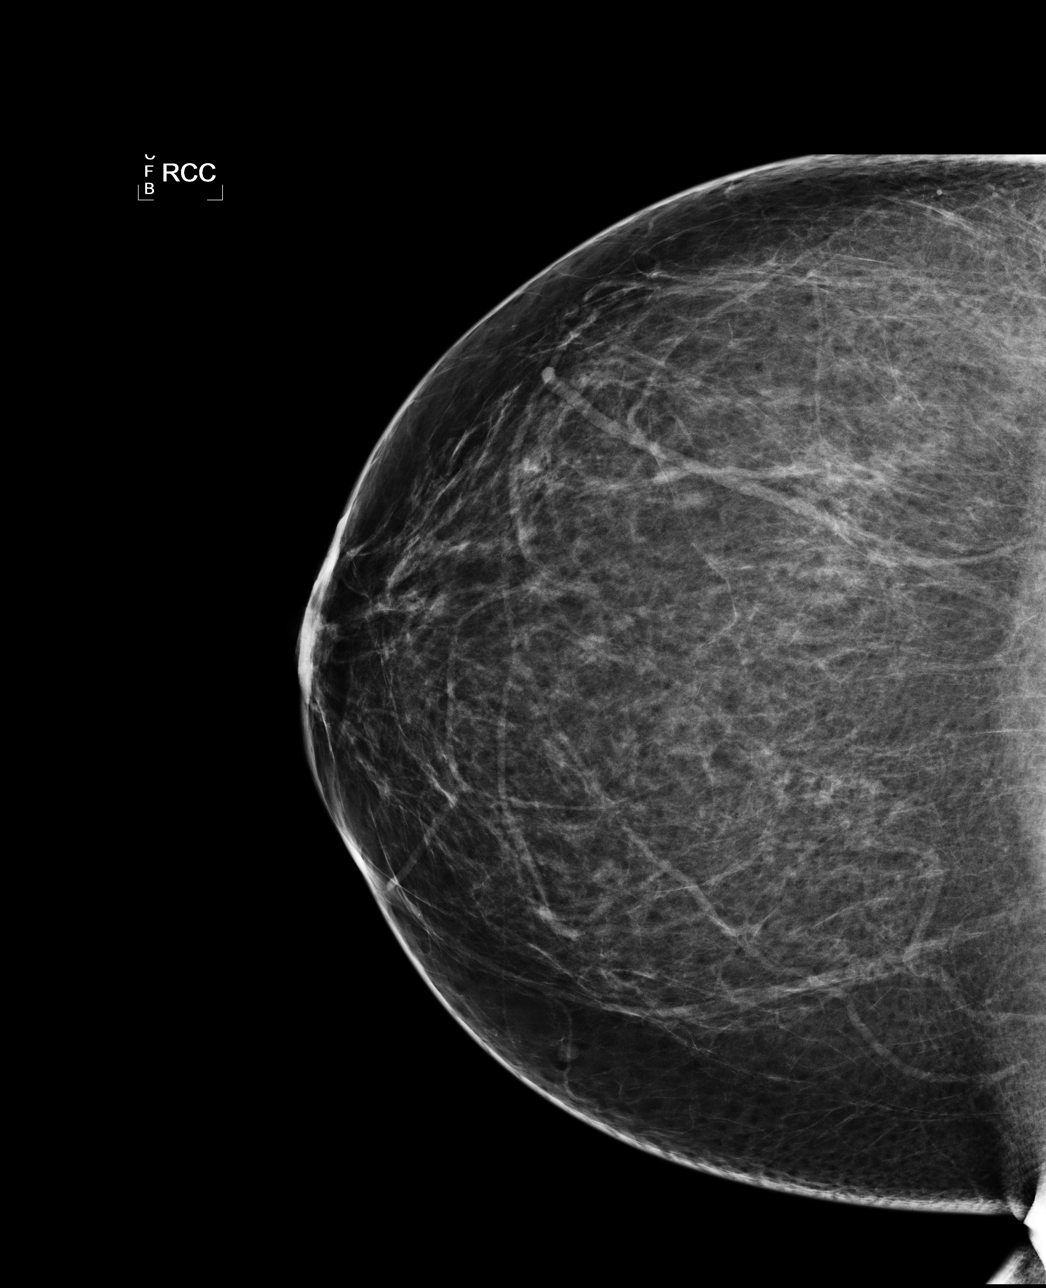

[L CC]
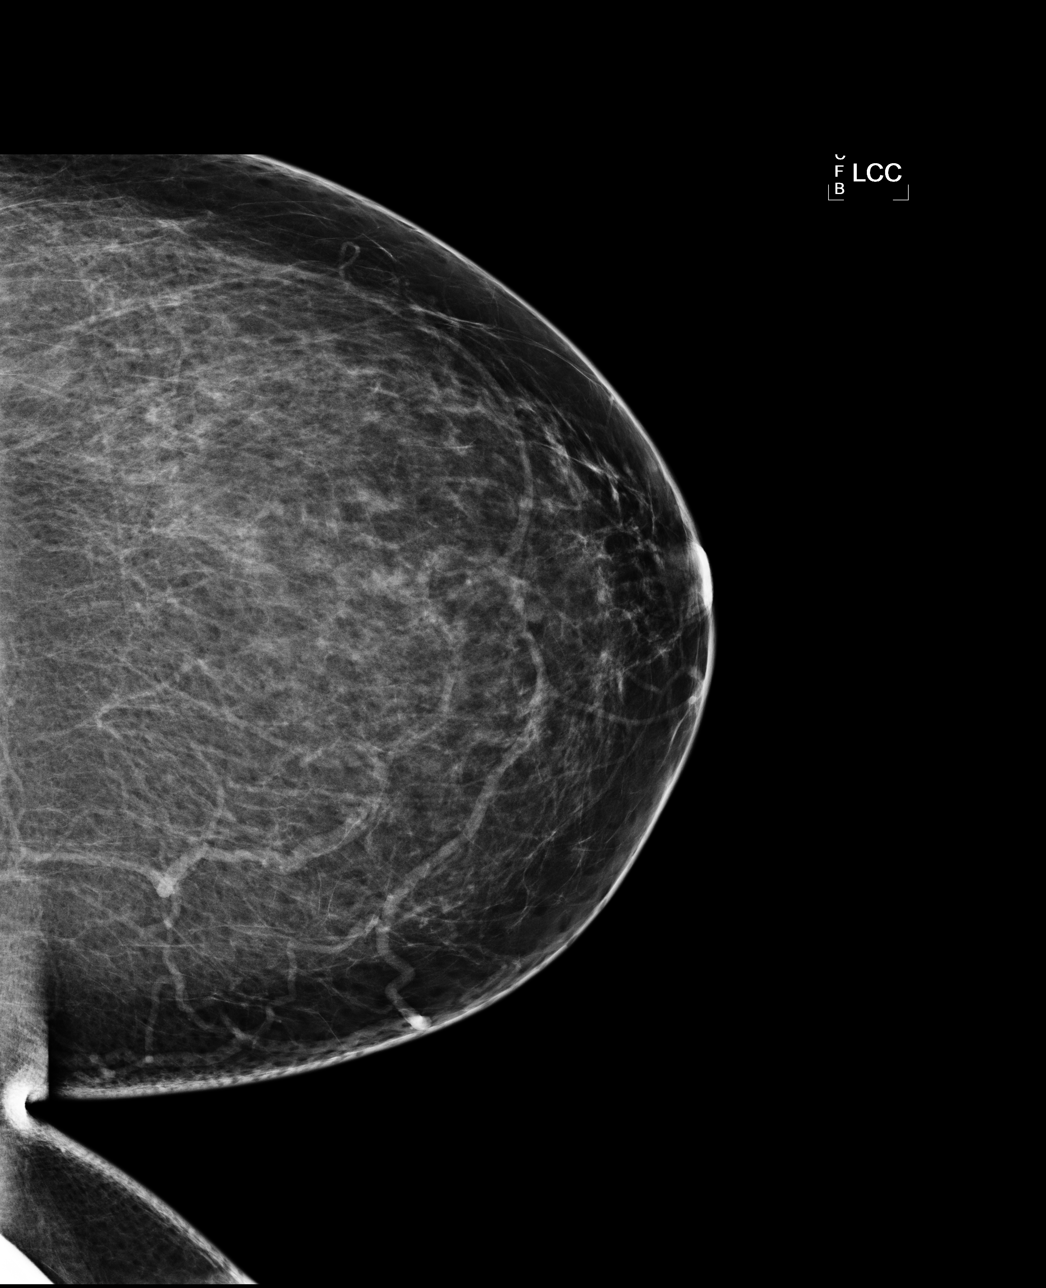

[L MLO]
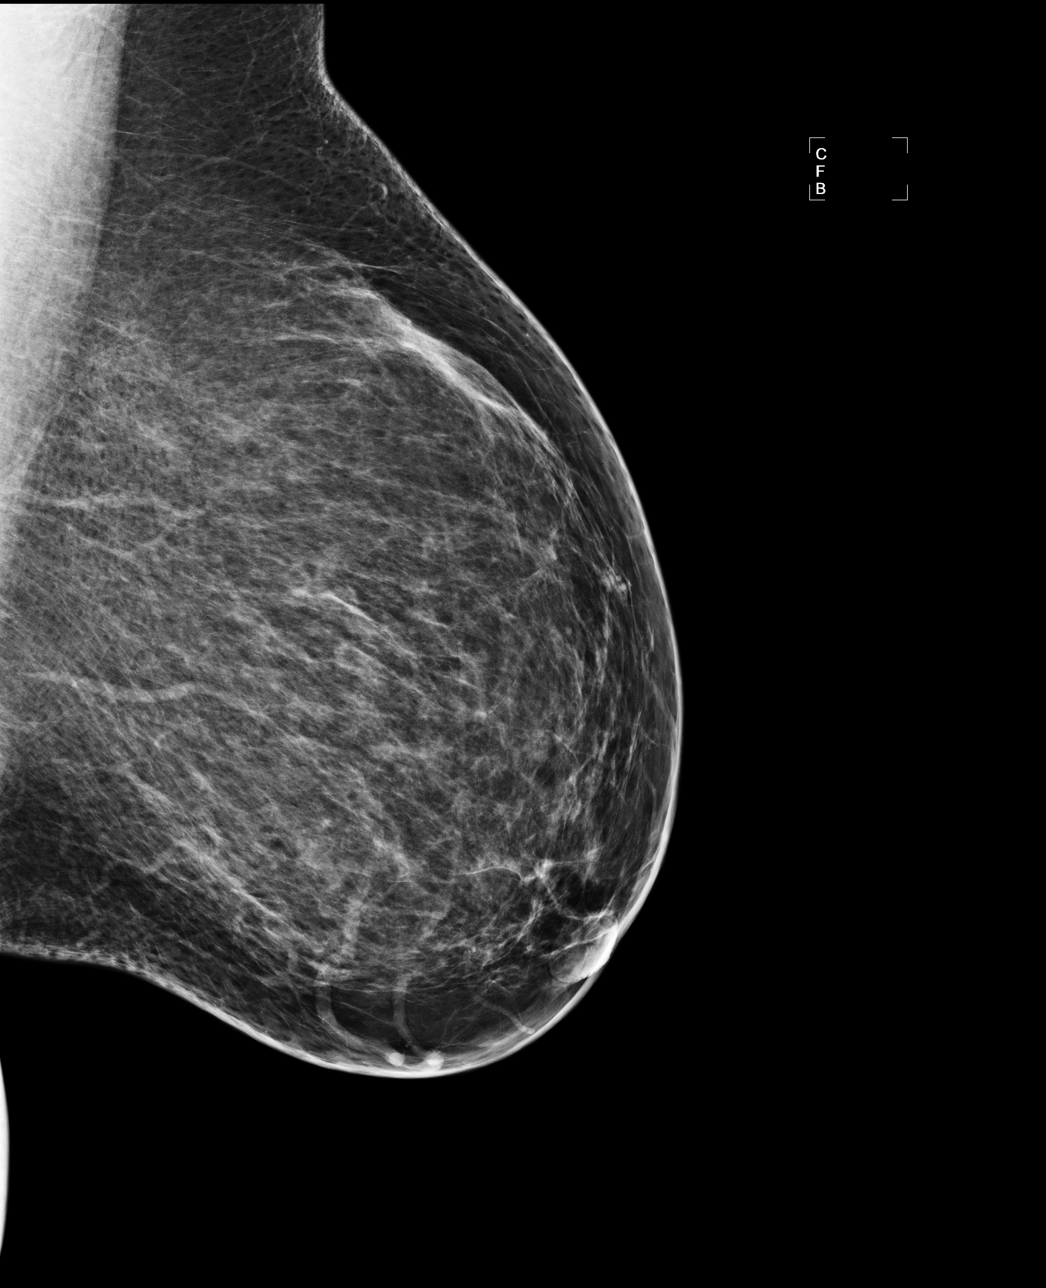

[R MLO]
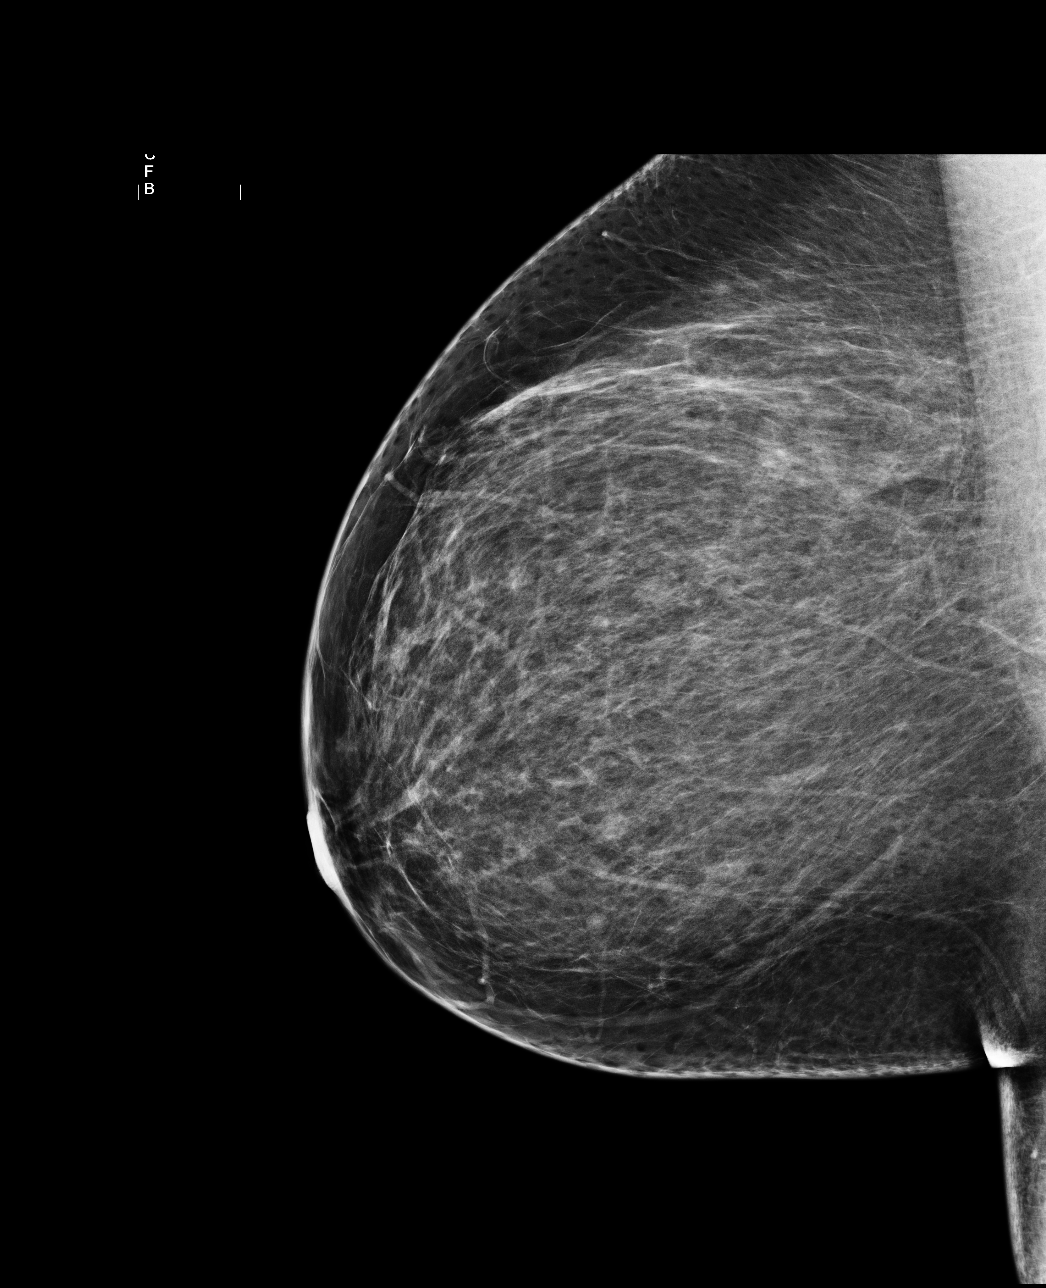

[4 of 4 positions shown; findings below may reference images not displayed]

The breast tissue is almost entirely fatty.  There is no dominant mass, architectural distortion or
calcification to suggest malignancy.

Images were processed with CAD.
IMPRESSION: No mammographic evidence of malignancy.  Suggest yearly screening mammography.

A result letter of this screening mammogram will be mailed directly to the patient.

ASSESSMENT: Negative - BI-RADS 1

Screening mammogram in 1 year.
,

## 2011-06-08 ENCOUNTER — Ambulatory Visit: Payer: Self-pay | Admitting: *Deleted

## 2011-06-25 ENCOUNTER — Encounter: Payer: Self-pay | Admitting: *Deleted

## 2011-06-25 ENCOUNTER — Encounter: Payer: Self-pay | Attending: Family Medicine | Admitting: *Deleted

## 2011-06-25 DIAGNOSIS — E669 Obesity, unspecified: Secondary | ICD-10-CM | POA: Insufficient documentation

## 2011-06-25 DIAGNOSIS — Z713 Dietary counseling and surveillance: Secondary | ICD-10-CM | POA: Insufficient documentation

## 2011-06-25 NOTE — Patient Instructions (Addendum)
Goals:  Eat 3 meals/day, Avoid meal skipping   Increase protein rich foods  Limit carbohydrate 3 servings/meal   Choose more whole grains, lean protein, low-fat dairy, and fruits/non-starchy vegetables.   Aim for >30 min of physical activity daily, consider Armchair exercises that you can do at home  Limit sugar-sweetened beverages and concentrated sweets

## 2011-06-25 NOTE — Progress Notes (Signed)
  Medical Nutrition Therapy:  Appt start time: 1500 end time:  1600.   Assessment:  Primary concerns today: patient here today for assistance with weight loss. She states she has a strong family history of diabetes and wants to avoid that if possible. 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 She does skip meals daily and enjoys sweet drinks. Activity is based on caring for small children and walking as weather permits  MEDICATIONS: none except birth control   DIETARY INTAKE:  Usual eating pattern includes 2 meals and 2-3 snacks per day.  Everyday foods include variety of all food groups.  Avoided foods include most breakfast foods and fried foods lately.    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   Nutritional Diagnosis:  NI-1.5 Excessive energy intake As related to obesity.  As evidenced by BMI of 45.5% .    Intervention:  Nutrition counseling provided on caloric content of macro-nutrients, carb counting, reading food labels, portion control and increasing activity as tolerated, perhaps using Armchair exerises. Goals:  Eat 3 meals/day, Avoid meal skipping   Increase protein rich foods  Limit carbohydrate 3 servings/meal   Choose more whole grains, lean protein, low-fat dairy, and fruits/non-starchy vegetables.   Aim for >30 min of physical activity  daily, consider Armchair exercises that you can do at home  Limit sugar-sweetened beverages and concentrated sweets    Handouts given during visit include:  Carb Counting and Food Label handouts  Meal Plan card  Armchair Exercise Book from bookstore  Monitoring/Evaluation:  Dietary intake, Armchair exercise, reading food labels, and body weight in 4 week(s).

## 2011-07-28 ENCOUNTER — Encounter: Payer: Self-pay | Attending: Family Medicine | Admitting: *Deleted

## 2011-07-28 ENCOUNTER — Encounter: Payer: Self-pay | Admitting: *Deleted

## 2011-07-28 DIAGNOSIS — Z713 Dietary counseling and surveillance: Secondary | ICD-10-CM | POA: Insufficient documentation

## 2011-07-28 DIAGNOSIS — E669 Obesity, unspecified: Secondary | ICD-10-CM | POA: Insufficient documentation

## 2011-07-28 NOTE — Progress Notes (Signed)
  Medical Nutrition Therapy:  Appt start time: 1500 end time:  1530.   Assessment:  Primary concerns today: follow up visit for obesity. Weight is basically stable however patient reports she is making significant changes in her eating habits and beverage choices. She has reduced sweet tea to 2 servings per week down from 10-12 /week, no more desserts and is eating salads for lunch. Still plans to resume [employer] classes when she finds some that she can do with her work schedule.  MEDICATIONS: none except birth control   DIETARY INTAKE:  Usual eating pattern includes 2 meals and 2-3 snacks per day.  Everyday foods include variety of all food groups.  Avoided foods include most breakfast foods and fried foods lately.    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   Nutritional Diagnosis:  NI-1.5 Excessive energy intake As related to obesity.  As evidenced by BMI of 45.5% .    Intervention:  Follow up nutrition counseling focusing more on fat grams on food labels to reduce calories coming from fats. Also emphasized importance of increasing activity level on a daily basis to increase metabolism.  Goals:  Eat 3 meals/day, Avoid meal skipping   Increase protein rich foods  Limit carbohydrate 2-3 servings/meal   Choose more whole grains, lean protein, low-fat dairy, and fruits/non-starchy vegetables.   Aim for >30 min of physical activity daily, consider Armchair exercises  that you can do at home  Continue to limit sugar-sweetened beverages and concentrated sweets   Monitoring/Evaluation:  Dietary intake, Armchair exercise, reading food labels, and body weight in 6 week(s).

## 2011-08-05 ENCOUNTER — Inpatient Hospital Stay (HOSPITAL_COMMUNITY): Payer: Self-pay

## 2011-08-05 ENCOUNTER — Encounter (HOSPITAL_COMMUNITY): Payer: Self-pay | Admitting: *Deleted

## 2011-08-05 ENCOUNTER — Inpatient Hospital Stay (HOSPITAL_COMMUNITY)
Admission: AD | Admit: 2011-08-05 | Discharge: 2011-08-05 | Disposition: A | Discharge status: Home / Self Care | Payer: Self-pay | Source: Ambulatory Visit | Attending: Obstetrics and Gynecology | Admitting: Obstetrics and Gynecology

## 2011-08-05 DIAGNOSIS — N939 Abnormal uterine and vaginal bleeding, unspecified: Secondary | ICD-10-CM

## 2011-08-05 DIAGNOSIS — N938 Other specified abnormal uterine and vaginal bleeding: Secondary | ICD-10-CM | POA: Insufficient documentation

## 2011-08-05 DIAGNOSIS — N949 Unspecified condition associated with female genital organs and menstrual cycle: Secondary | ICD-10-CM | POA: Insufficient documentation

## 2011-08-05 HISTORY — DX: Benign neoplasm of connective and other soft tissue, unspecified: D21.9

## 2011-08-05 LAB — CBC
MCH: 29.9 pg (ref 26.0–34.0)
MCHC: 33.8 g/dL (ref 30.0–36.0)
MCV: 88.4 fL (ref 78.0–100.0)
Platelets: 237 10*3/uL (ref 150–400)
RBC: 4.05 MIL/uL (ref 3.87–5.11)
RDW: 14.7 % (ref 11.5–15.5)

## 2011-08-05 LAB — HCG, QUANTITATIVE, PREGNANCY: hCG, Beta Chain, Quant, S: <1 m[IU]/mL (ref <5)

## 2011-08-05 MED ORDER — MEDROXYPROGESTERONE ACETATE 10 MG PO TABS: 10.0000 mg | ORAL_TABLET | Freq: Every day | ORAL | Status: DC

## 2011-08-05 NOTE — Discharge Instructions (Signed)
Abnormal Uterine Bleeding Abnormal uterine bleeding can have many causes. Some cases are simply treated, while others are more serious. There are several kinds of bleeding that is considered abnormal, including:  Bleeding between periods.   Bleeding after sexual intercourse.   Spotting anytime in the menstrual cycle.   Bleeding heavier or more than normal.   Bleeding after menopause.  CAUSES  There are many causes of abnormal uterine bleeding. It can be present in teenagers, pregnant women, women during their reproductive years, and women who have reached menopause. Your caregiver will look for the more common causes depending on your age, signs, symptoms and your particular circumstance. Most cases are not serious and can be treated. Even the more serious causes, like cancer of the female organs, can be treated adequately if found in the early stages. That is why all types of bleeding should be evaluated and treated as soon as possible. DIAGNOSIS  Diagnosing the cause may take several kinds of tests. Your caregiver may:  Take a complete history of the type of bleeding.   Perform a complete physical exam and Pap smear.   Take an ultrasound on the abdomen showing a picture of the female organs and the pelvis.   Inject dye into the uterus and Fallopian tubes and X-ray them (hysterosalpingogram).   Place fluid in the uterus and do an ultrasound (sonohysterogrqphy).   Take a CT scan to examine the female organs and pelvis.   Take an MRI to examine the female organs and pelvis. There is no X-ray involved with this procedure.   Look inside the uterus with a telescope that has a light at the end (hysteroscopy).   Scrap the inside of the uterus to get tissue to examine (Dilatation and Curettage, D&C).   Look into the pelvis with a telescope that has a light at the end (laparoscopy). This is done through a very small cut (incision) in the abdomen.  TREATMENT  Treatment will depend on the  cause of the abnormal bleeding. It can include:  Doing nothing to allow the problem to take care of itself over time.   Hormone treatment.   Birth control pills.   Treating the medical condition causing the problem.   Laparoscopy.   Major or minor surgery   Destroying the lining of the uterus with electrical currant, laser, freezing or heat (uterine ablation).  HOME CARE INSTRUCTIONS   Follow your caregiver's recommendation on how to treat your problem.   See your caregiver if you missed a menstrual period and think you may be pregnant.   If you are bleeding heavily, count the number of pads/tampons you use and how often you have to change them. Tell this to your caregiver.   Avoid sexual intercourse until the problem is controlled.  SEEK MEDICAL CARE IF:   You have any kind of abnormal bleeding mentioned above.   You feel dizzy at times.   You are 46 years old and have not had a menstrual period yet.  SEEK IMMEDIATE MEDICAL CARE IF:   You pass out.   You are changing pads/tampons every 15 to 30 minutes.   You have belly (abdominal) pain.   You have a temperature of 100 F (37.8 C) or higher.   You become sweaty or weak.   You are passing large blood clots from the vagina.   You start to feel sick to your stomach (nauseous) and throw up (vomit).  Document Released: 03/23/2005 Document Revised: 03/12/2011 Document Reviewed: 08/16/2008 [employer]   Patient Information 2012 [employer]. 

## 2011-08-05 NOTE — MAU Note (Signed)
Patient states she has been getting Depo injections every 3 months for 5-6 years. States she will start bleeding about 2 weeks before it is due and for about 2 weeks after the shot. Her last injection was 4-8 and she has continued to bleed. Has some abdominal and right side pain.

## 2011-08-05 NOTE — MAU Note (Signed)
Pt in c/o vaginal bleeding x35 days.  Received depo on 07/13/11.  States she typically bleeds for 2 weeks prior to depo and a few days after but then it stops.  Has not subsided this time.  Going through 2 pads an hour.  Reports nausea, dizziness, and fatigue.

## 2011-08-05 NOTE — MAU Provider Note (Signed)
History     CSN: [id number]  Arrival date and time: 08/05/11 1101   None     Chief Complaint  Patient presents with  . Vaginal Bleeding   HPI 46 year old female who presents the MAU with heavy bright red vaginal bleeding that started 35 days ago and is accompanied with uterine cramping. She has been on Depo-Provera for the past 5-6 years for her menorrhagia and has received these injections every 3 months. Her last injection was 07/13/2011. She began bleeding a few days before and has not stopped bleeding.  Nothing has improved the bleeding.  OB History    Grav Para Term Preterm Abortions TAB SAB Ect Mult Living   3 3 3  0 0 0 0 0  3      Past Medical History  Diagnosis Date  . Obesity   . Fibroids     Past Surgical History  Procedure Date  . Cholecystectomy   . Cesarean section     Family History  Problem Relation Age of Onset  . Anesthesia problems Neg Hx     History  Substance Use Topics  . Smoking status: Never Smoker   . Smokeless tobacco: Never Used  . Alcohol Use: Yes     once a month; wine or vodka drink very occasionally    Allergies: No Known Allergies  Prescriptions prior to admission  Medication Sig Dispense Refill  . ibuprofen (ADVIL,MOTRIN) 200 MG tablet Take 800 mg by mouth every 6 (six) hours as needed. For pain      . Probiotic Product (PROBIOTIC FORMULA PO) Take 1 capsule by mouth every morning.      . medroxyPROGESTERone (DEPO-PROVERA) 150 MG/ML injection Inject 150 mg into the muscle every 3 (three) months.        ROS Physical Exam   Blood pressure 121/78, pulse 82, temperature 99.5 F (37.5 C), temperature source Oral, resp. rate 20, height 5' 2.5" (1.588 m), weight 108.863 kg (240 lb), last menstrual period 06/29/2011, SpO2 100.00%.  Physical Exam  Constitutional: She is oriented to person, place, and time. She appears well-developed and well-nourished.  GI: Soft. Bowel sounds are normal. She exhibits no distension and no mass.  There is no tenderness. There is no rebound and no guarding.  Neurological: She is alert and oriented to person, place, and time.  Skin: Skin is warm and dry.  Psychiatric: She has a normal mood and affect. Her behavior is normal. Judgment and thought content normal.    Results for orders placed during the hospital encounter of 08/05/11 (from the past 24 hour(s))  CBC     Status: Abnormal   Collection Time   08/05/11 11:10 AM      Component Value Range   WBC 7.0  4.0 - 10.5 (K/uL)   RBC 4.05  3.87 - 5.11 (MIL/uL)   Hemoglobin 12.1  12.0 - 15.0 (g/dL)   HCT 40.9 (*) 81.1 - 46.0 (%)   MCV 88.4  78.0 - 100.0 (fL)   MCH 29.9  26.0 - 34.0 (pg)   MCHC 33.8  30.0 - 36.0 (g/dL)   RDW 91.4  78.2 - 95.6 (%)   Platelets 237  150 - 400 (K/uL)  HCG, QUANTITATIVE, PREGNANCY     Status: Normal   Collection Time   08/05/11 11:10 AM      Component Value Range   hCG, Beta Chain, Quant, S <1  <5 (mIU/mL)  POCT PREGNANCY, URINE     Status: Normal   Collection Time  08/05/11  1:45 PM      Component Value Range   Preg Test, Ur NEGATIVE  NEGATIVE      [place]: Findings:  Uterus: 9.0 x 5.3 x 7.3 cm. No fibroids identified.  Endometrium: A single irregular fluid collection is seen in the  fundal portion of the endometrial cavity which measures 12 mm, and  is nonspecific. This could represent blood products, cystic  endometrial polyp, or abnormal intrauterine gestational sac.  Right ovary: 3.5 x 1.8 x 2.1 cm. Normal appearance.  Left ovary: not directly visualized by transabdominal or  transvaginal sonography, however no adnexal mass identified.  Other Findings: No free fluid  IMPRESSION:  1. Single 12 mm irregular fluid collection in the fundal portion  of the endometrial cavity, which is nonspecific. Differential  diagnosis includes blood products, cystic endometrial polyp, and  abnormal intrauterine gestational sac. Recommend correlation with  quantitative beta HCG level.  2. No adnexal mass or free  fluid identified.  MAU Course  Procedures   Assessment and Plan  #1 dysfunction uterine bleeding We'll refer patient to GYN clinic for endometrial biopsy. Will give patient a prescription for Provera to decrease vaginal bleeding. Patient to return if continues to have profuse bleeding despite the Provera.  Sharra Cayabyab JEHIEL 08/05/2011, 12:19 PM

## 2011-08-05 NOTE — MAU Note (Signed)
Patient is not in the lobby when called to triage.  

## 2011-08-07 ENCOUNTER — Other Ambulatory Visit: Payer: Self-pay | Admitting: Family Medicine

## 2011-09-23 ENCOUNTER — Other Ambulatory Visit (HOSPITAL_COMMUNITY)
Admission: RE | Admit: 2011-09-23 | Discharge: 2011-09-23 | Disposition: A | Discharge status: Home / Self Care | Payer: Self-pay | Source: Ambulatory Visit | Attending: Obstetrics and Gynecology | Admitting: Obstetrics and Gynecology

## 2011-09-23 ENCOUNTER — Ambulatory Visit (INDEPENDENT_AMBULATORY_CARE_PROVIDER_SITE_OTHER): Payer: Self-pay | Admitting: Obstetrics and Gynecology

## 2011-09-23 VITALS — BP 125/89 | HR 79 | Temp 97.0°F | Ht 62.0 in | Wt 240.0 lb

## 2011-09-23 DIAGNOSIS — N939 Abnormal uterine and vaginal bleeding, unspecified: Secondary | ICD-10-CM | POA: Insufficient documentation

## 2011-09-23 DIAGNOSIS — N926 Irregular menstruation, unspecified: Secondary | ICD-10-CM | POA: Insufficient documentation

## 2011-09-23 DIAGNOSIS — N920 Excessive and frequent menstruation with regular cycle: Secondary | ICD-10-CM

## 2011-09-23 NOTE — Progress Notes (Signed)
  Subjective:    Patient ID: Shirley Savage, female    DOB: September 29, 1965, 46 y.o.   MRN: [id number]  HPI  46 yo G3P3 with BMI 43 presenting today as a referral from [facility] for evaluation of abnormal uterine bleeding. Patient reports onset of heavy bleeding lasting 2 weeks 11 years ago following BTL. Patient was initially well managed with NuvaRing and more recently with Depo-Provera. Patient reports onset of vaginal bleeding in March lasting until May, going through phases of spotting and heavy bleeding with clots.   Past Medical History  Diagnosis Date  . Obesity   . Fibroids    Past Surgical History  Procedure Date  . Cholecystectomy   . Cesarean section    Family History  Problem Relation Age of Onset  . Anesthesia problems Neg Hx    . Review of Systems  All other systems reviewed and are negative.       Objective:   Physical Exam  GENERAL: Well-developed, well-nourished female in no acute distress.  ABDOMEN: Soft, nontender, nondistended. No organomegaly. Obese PELVIC: Normal external female genitalia. Vagina is pink and rugated.  Normal discharge. Normal appearing cervix. Bimanual limited secondary to body habitus..  No adnexal mass or tenderness. EXTREMITIES: No cyanosis, clubbing, or edema, 2+ distal pulses.       Assessment & Plan:  46 yo with abnormal uterine bleeding - endometrial biopsy performed  ENDOMETRIAL BIOPSY     The indications for endometrial biopsy were reviewed.   Risks of the biopsy including cramping, bleeding, infection, uterine perforation, inadequate specimen and need for additional procedures  were discussed. The patient states she understands and agrees to undergo procedure today. Consent was signed. Time out was performed. Urine HCG was negative. A sterile speculum was placed in the patient's vagina and the cervix was prepped with Betadine. A single-toothed tenaculum was placed on the anterior lip of the cervix to stabilize it. The  uterine cavity was sounded to a depth of 8 cm using the uterine sound. The 3 mm pipelle was introduced into the endometrial cavity without difficulty, 2 passes were made.  A  moderate amount of tissue was  sent to pathology. The instruments were removed from the patient's vagina. Minimal bleeding from the cervix was noted. The patient tolerated the procedure well.  Routine post-procedure instructions were given to the patient. The patient will follow up in two weeks to review the results and for further management.   - Discussed medical management with Mirena IUD - patient had a recent normal ultrasound with thickened endometrial lining 12 mm at fundus - RTC in 2 weeks for further management

## 2011-10-06 ENCOUNTER — Telehealth: Payer: Self-pay | Admitting: *Deleted

## 2011-10-06 NOTE — Telephone Encounter (Signed)
Pt left message requesting results 

## 2011-10-06 NOTE — Telephone Encounter (Signed)
Called and spoke w/pt.  I informed her that her Bx did not show any cancer. She will still need her follow up appt on 7/11 to discuss plan of care w/Dr. Constant.  Pt voiced understanding.

## 2011-10-13 ENCOUNTER — Telehealth: Payer: Self-pay | Admitting: *Deleted

## 2011-10-13 NOTE — Telephone Encounter (Signed)
Pt called with question about getting her next depo provera injection.  Her tubes have been tied 12 years ago and she takes the Depo to control heavy vaginal bleeding. She last received the injection @ [facility] on 07/14/11. She had an appt for injection on 6/26 but was re-scheduled by [facility] to today. Now, she has been called again by [facility] and re-scheduled for injection on 8/8 because no doctor will be there today. She is concerned because she has already started a period on 6/21 and usually her bleeding does not stop until her next Depo injection. She does not want to wait until 8/8. She states that she was told by [facility] RN to call our clinic and ask what she should do and/or we could fax them some information as to what they should do.  I told pt that since she has a clinic appt on 7/11, she may discuss with Dr. Jolayne Panther.  If it is determined that this is still a good plan of care for her, she may receive her injection while she is here. Pt voiced understanding and agreed.

## 2011-10-15 ENCOUNTER — Ambulatory Visit (INDEPENDENT_AMBULATORY_CARE_PROVIDER_SITE_OTHER): Payer: Self-pay | Admitting: Obstetrics and Gynecology

## 2011-10-15 VITALS — BP 108/72 | HR 85 | Temp 98.5°F | Ht 62.0 in | Wt 244.4 lb

## 2011-10-15 DIAGNOSIS — N920 Excessive and frequent menstruation with regular cycle: Secondary | ICD-10-CM

## 2011-10-15 LAB — POCT PREGNANCY, URINE: Preg Test, Ur: NEGATIVE

## 2011-10-15 MED ORDER — FERROUS SULFATE 325 (65 FE) MG PO TABS: 325.0000 mg | ORAL_TABLET | Freq: Two times a day (BID) | ORAL | Status: DC

## 2011-10-15 MED ORDER — IBUPROFEN 600 MG PO TABS: 600.0000 mg | ORAL_TABLET | Freq: Four times a day (QID) | ORAL | Status: AC | PRN

## 2011-10-15 MED ORDER — MEDROXYPROGESTERONE ACETATE 150 MG/ML IM SUSP
150.0000 mg | INTRAMUSCULAR | Status: DC
  Administered 2011-10-15: 150 mg via INTRAMUSCULAR

## 2011-10-15 NOTE — Progress Notes (Signed)
Patient ID: Shirley Savage, female   DOB: Oct 30, 1965, 46 y.o.   MRN: [id number] Patient presents today to discuss results of endometrial biopsy and further management of menorrhagia. Results of biopsy reviewed with patient which demonstrated polypoid tissue but otherwise negative for dysplasia or malignancy.  Discussed removal of polyp via D&C which can be therapeutic management of her menorrhagia. Patient agreed to this. Risks, benefits and alternatives explained including but not limited to risk of bleeding, infection and damage to adjacent organs. Patient verbalized understanding.  In the meantime, patient will receive Depo-Provera injection and provera to manage her menorrhagia.

## 2011-10-27 ENCOUNTER — Encounter (HOSPITAL_COMMUNITY): Payer: Self-pay | Admitting: *Deleted

## 2011-11-04 ENCOUNTER — Encounter (HOSPITAL_COMMUNITY): Payer: Self-pay | Admitting: Pharmacist

## 2011-11-12 ENCOUNTER — Ambulatory Visit (HOSPITAL_COMMUNITY): Payer: Medicaid Other | Admitting: Anesthesiology

## 2011-11-12 ENCOUNTER — Encounter (HOSPITAL_COMMUNITY)
Admission: RE | Disposition: A | Discharge status: Home / Self Care | Payer: Self-pay | Source: Ambulatory Visit | Attending: Obstetrics and Gynecology

## 2011-11-12 ENCOUNTER — Encounter (HOSPITAL_COMMUNITY): Payer: Self-pay | Admitting: Anesthesiology

## 2011-11-12 ENCOUNTER — Ambulatory Visit (HOSPITAL_COMMUNITY)
Admission: RE | Admit: 2011-11-12 | Discharge: 2011-11-12 | Disposition: A | Discharge status: Home / Self Care | Payer: Medicaid Other | Source: Ambulatory Visit | Attending: Obstetrics and Gynecology | Admitting: Obstetrics and Gynecology

## 2011-11-12 DIAGNOSIS — N920 Excessive and frequent menstruation with regular cycle: Secondary | ICD-10-CM

## 2011-11-12 DIAGNOSIS — N938 Other specified abnormal uterine and vaginal bleeding: Secondary | ICD-10-CM | POA: Insufficient documentation

## 2011-11-12 DIAGNOSIS — N949 Unspecified condition associated with female genital organs and menstrual cycle: Secondary | ICD-10-CM | POA: Insufficient documentation

## 2011-11-12 DIAGNOSIS — R9389 Abnormal findings on diagnostic imaging of other specified body structures: Secondary | ICD-10-CM | POA: Insufficient documentation

## 2011-11-12 HISTORY — DX: Anemia, unspecified: D64.9

## 2011-11-12 HISTORY — PX: HYSTEROSCOPY WITH D & C: SHX1775

## 2011-11-12 LAB — CBC
HCT: 35.1 % — ABNORMAL LOW (ref 36.0–46.0)
Hemoglobin: 11.7 g/dL — ABNORMAL LOW (ref 12.0–15.0)
MCH: 29 pg (ref 26.0–34.0)
MCHC: 33.3 g/dL (ref 30.0–36.0)
RDW: 14.9 % (ref 11.5–15.5)

## 2011-11-12 SURGERY — DILATATION AND CURETTAGE /HYSTEROSCOPY
Anesthesia: General | Site: Uterus | Wound class: Clean Contaminated

## 2011-11-12 MED ORDER — METOCLOPRAMIDE HCL 5 MG/ML IJ SOLN: 10.0000 mg | Freq: Once | INTRAMUSCULAR | Status: DC | PRN

## 2011-11-12 MED ORDER — MEPERIDINE HCL 25 MG/ML IJ SOLN: 6.2500 mg | INTRAMUSCULAR | Status: DC | PRN

## 2011-11-12 MED ORDER — DEXAMETHASONE SODIUM PHOSPHATE 4 MG/ML IJ SOLN
INTRAMUSCULAR | Status: DC | PRN
  Administered 2011-11-12: 10 mg via INTRAVENOUS

## 2011-11-12 MED ORDER — GLYCINE 1.5 % IR SOLN
Status: DC | PRN
  Administered 2011-11-12: 1

## 2011-11-12 MED ORDER — MIDAZOLAM HCL 5 MG/5ML IJ SOLN
INTRAMUSCULAR | Status: DC | PRN
  Administered 2011-11-12: 2 mg via INTRAVENOUS

## 2011-11-12 MED ORDER — FENTANYL CITRATE 0.05 MG/ML IJ SOLN
INTRAMUSCULAR | Status: AC
  Filled 2011-11-12: qty 2

## 2011-11-12 MED ORDER — FENTANYL CITRATE 0.05 MG/ML IJ SOLN
INTRAMUSCULAR | Status: AC
  Administered 2011-11-12: 50 ug via INTRAVENOUS
  Filled 2011-11-12: qty 2

## 2011-11-12 MED ORDER — LIDOCAINE HCL (CARDIAC) 20 MG/ML IV SOLN
INTRAVENOUS | Status: AC
  Filled 2011-11-12: qty 5

## 2011-11-12 MED ORDER — FENTANYL CITRATE 0.05 MG/ML IJ SOLN
25.0000 ug | INTRAMUSCULAR | Status: DC | PRN
  Administered 2011-11-12 (×2): 50 ug via INTRAVENOUS

## 2011-11-12 MED ORDER — PROPOFOL 10 MG/ML IV EMUL
INTRAVENOUS | Status: DC | PRN
  Administered 2011-11-12: 150 mg via INTRAVENOUS
  Administered 2011-11-12: 50 mg via INTRAVENOUS

## 2011-11-12 MED ORDER — LIDOCAINE HCL (CARDIAC) 20 MG/ML IV SOLN
INTRAVENOUS | Status: DC | PRN
  Administered 2011-11-12: 80 mg via INTRAVENOUS

## 2011-11-12 MED ORDER — KETOROLAC TROMETHAMINE 30 MG/ML IJ SOLN
INTRAMUSCULAR | Status: DC | PRN
  Administered 2011-11-12: 30 mg via INTRAVENOUS

## 2011-11-12 MED ORDER — MIDAZOLAM HCL 2 MG/2ML IJ SOLN
INTRAMUSCULAR | Status: AC
  Filled 2011-11-12: qty 2

## 2011-11-12 MED ORDER — CHLOROPROCAINE HCL 1 % IJ SOLN
INTRAMUSCULAR | Status: AC
  Filled 2011-11-12: qty 30

## 2011-11-12 MED ORDER — OXYCODONE-ACETAMINOPHEN 5-325 MG PO TABS: 1.0000 | ORAL_TABLET | ORAL | Status: AC | PRN

## 2011-11-12 MED ORDER — CHLOROPROCAINE HCL 1 % IJ SOLN
INTRAMUSCULAR | Status: DC | PRN
  Administered 2011-11-12: 10 mL

## 2011-11-12 MED ORDER — FENTANYL CITRATE 0.05 MG/ML IJ SOLN
INTRAMUSCULAR | Status: DC | PRN
  Administered 2011-11-12: 100 ug via INTRAVENOUS

## 2011-11-12 MED ORDER — ONDANSETRON HCL 4 MG/2ML IJ SOLN
INTRAMUSCULAR | Status: AC
  Filled 2011-11-12: qty 2

## 2011-11-12 MED ORDER — ONDANSETRON HCL 4 MG/2ML IJ SOLN
INTRAMUSCULAR | Status: DC | PRN
  Administered 2011-11-12: 4 mg via INTRAVENOUS

## 2011-11-12 MED ORDER — KETOROLAC TROMETHAMINE 30 MG/ML IJ SOLN
INTRAMUSCULAR | Status: AC
  Filled 2011-11-12: qty 1

## 2011-11-12 MED ORDER — LACTATED RINGERS IV SOLN
INTRAVENOUS | Status: DC
  Administered 2011-11-12: 50 mL/h via INTRAVENOUS
  Administered 2011-11-12: 14:00:00 via INTRAVENOUS

## 2011-11-12 MED ORDER — DEXAMETHASONE SODIUM PHOSPHATE 10 MG/ML IJ SOLN
INTRAMUSCULAR | Status: AC
  Filled 2011-11-12: qty 1

## 2011-11-12 MED ORDER — PROPOFOL 10 MG/ML IV EMUL
INTRAVENOUS | Status: AC
  Filled 2011-11-12: qty 20

## 2011-11-12 SURGICAL SUPPLY — 10 items
CATH ROBINSON RED A/P 16FR (CATHETERS) ×2 IMPLANT
CONTAINER PREFILL 10% NBF 60ML (FORM) ×3 IMPLANT
GLOVE BIOGEL PI IND STRL 6.5 (GLOVE) ×2 IMPLANT
GLOVE BIOGEL PI INDICATOR 6.5 (GLOVE) ×2
GLOVE SURG SS PI 6.0 STRL IVOR (GLOVE) ×2 IMPLANT
GOWN PREVENTION PLUS LG XLONG (DISPOSABLE) ×3 IMPLANT
GOWN STRL REIN XL XLG (GOWN DISPOSABLE) ×2 IMPLANT
PACK HYSTEROSCOPY LF (CUSTOM PROCEDURE TRAY) ×2 IMPLANT
TOWEL OR 17X24 6PK STRL BLUE (TOWEL DISPOSABLE) ×4 IMPLANT
WATER STERILE IRR 1000ML POUR (IV SOLUTION) ×2 IMPLANT

## 2011-11-12 NOTE — H&P (Signed)
Shirley Savage is an 46 y.o. female G3P3 with abnormal uterine bleeding and thickened endometrium on ultrasound. Patient presents today for scheduled D&C polypectomy. Patient was previously medically managed with Nuvaring and Depo-provera. Endometrial biopsy was negative for hyperplasia and malignancy, with presence of polypoid tissue.  Pertinent Gynecological History: Menses: flow is excessive with use of 10 pads or tampons on heaviest days Bleeding: dysfunctional uterine bleeding Contraception: tubal ligation DES exposure: denies Blood transfusions: none Sexually transmitted diseases: no past history Previous GYN Procedures: n/a  Last mammogram: normal Date: 08/2010 Last pap: normal Date: 07/2010 OB History: G3, P3   Menstrual History: Patient's last menstrual period was 09/25/2011.    Past Medical History  Diagnosis Date  . Obesity   . Fibroids   . Anemia     Past Surgical History  Procedure Date  . Cholecystectomy   . Cesarean section   . Tubal ligation   . Wisdom tooth extraction     Family History  Problem Relation Age of Onset  . Anesthesia problems Neg Hx     Social History:  reports that she has never smoked. She has never used smokeless tobacco. She reports that she drinks alcohol. She reports that she does not use illicit drugs.  Allergies: No Known Allergies  Prescriptions prior to admission  Medication Sig Dispense Refill  . ferrous sulfate (FERROUSUL) 325 (65 FE) MG tablet Take 1 tablet (325 mg total) by mouth 2 (two) times daily.  60 tablet  1  . ibuprofen (ADVIL,MOTRIN) 200 MG tablet Take 600 mg by mouth every 6 (six) hours as needed. For pain      . medroxyPROGESTERone (DEPO-PROVERA) 150 MG/ML injection Inject 150 mg into the muscle every 3 (three) months.        Review of Systems  All other systems reviewed and are negative.    Blood pressure 118/66, pulse 70, temperature 98.6 F (37 C), temperature source Oral, resp. rate 16, height 5\' 2"   (1.575 m), weight 108.863 kg (240 lb), last menstrual period 09/25/2011, SpO2 98.00%. Physical Exam GENERAL: Well-developed, well-nourished female in no acute distress.  HEENT: Normocephalic, atraumatic. Sclerae anicteric.  NECK: Supple. Normal thyroid.  LUNGS: Clear to auscultation bilaterally.  HEART: Regular rate and rhythm. ABDOMEN: Soft, nontender, nondistended. No organomegaly. PELVIC: Deferred to OR EXTREMITIES: No cyanosis, clubbing, or edema, 2+ distal pulses.  Results for orders placed during the hospital encounter of 11/12/11 (from the past 24 hour(s))  CBC     Status: Abnormal   Collection Time   11/12/11  1:23 PM      Component Value Range   WBC 5.6  4.0 - 10.5 K/uL   RBC 4.03  3.87 - 5.11 MIL/uL   Hemoglobin 11.7 (*) 12.0 - 15.0 g/dL   HCT 16.1 (*) 09.6 - 04.5 %   MCV 87.1  78.0 - 100.0 fL   MCH 29.0  26.0 - 34.0 pg   MCHC 33.3  30.0 - 36.0 g/dL   RDW 40.9  81.1 - 91.4 %   Platelets 249  150 - 400 K/uL  PREGNANCY, URINE     Status: Normal   Collection Time   11/12/11  1:24 PM      Component Value Range   Preg Test, Ur NEGATIVE  NEGATIVE    No results found.  Assessment/Plan: 46 yo G3P3 with abnormal uterine bleeding here for D&C polypectomy Risks, benefits and alternatives were explained including but not limited to risk of bleeding, infection and damage to adjacent organs.  Patient verbalized understanding. All questions were answered. Consent signed  Jolanta Cabeza 11/12/2011, 2:00 PM

## 2011-11-12 NOTE — Anesthesia Preprocedure Evaluation (Signed)
Anesthesia Evaluation  Patient identified by MRN, date of birth, ID band Patient awake    Reviewed: Allergy & Precautions, H&P , NPO status , Patient's Chart, lab work & pertinent test results  Airway Mallampati: III TM Distance: >3 FB Neck ROM: full    Dental No notable dental hx. (+) Teeth Intact   Pulmonary neg pulmonary ROS,  breath sounds clear to auscultation  Pulmonary exam normal       Cardiovascular negative cardio ROS  Rhythm:regular Rate:Normal     Neuro/Psych negative neurological ROS  negative psych ROS   GI/Hepatic negative GI ROS, Neg liver ROS,   Endo/Other  negative endocrine ROS  Renal/GU negative Renal ROS  negative genitourinary   Musculoskeletal   Abdominal Normal abdominal exam  (+)   Peds  Hematology negative hematology ROS (+)   Anesthesia Other Findings   Reproductive/Obstetrics negative OB ROS                           Anesthesia Physical Anesthesia Plan  ASA: III  Anesthesia Plan: General LMA   Post-op Pain Management:    Induction:   Airway Management Planned:   Additional Equipment:   Intra-op Plan:   Post-operative Plan:   Informed Consent: I have reviewed the patients History and Physical, chart, labs and discussed the procedure including the risks, benefits and alternatives for the proposed anesthesia with the patient or authorized representative who has indicated his/her understanding and acceptance.   Dental Advisory Given  Plan Discussed with: Anesthesiologist, Surgeon and CRNA  Anesthesia Plan Comments:         Anesthesia Quick Evaluation

## 2011-11-12 NOTE — Op Note (Signed)
PREOPERATIVE DIAGNOSIS:  Abnormal uterine bleeding. POSTOPERATIVE DIAGNOSIS: The same PROCEDURE: Hysteroscopy, Dilation and Curettage. SURGEON:  Dr. Catalina Antigua   INDICATIONS: 46 y.o. [id number]  here for scheduled surgery for D&C hysteroscopy, possible polypectomy.   Risks of surgery were discussed with the patient including but not limited to: bleeding which may require transfusion; infection which may require antibiotics; injury to uterus or surrounding organs; intrauterine scarring which may impair future fertility; need for additional procedures including laparotomy or laparoscopy; and other postoperative/anesthesia complications. Written informed consent was obtained.    FINDINGS:  A 8-week size uterus.  Diffuse proliferative endometrium.  No polyp was visualized. Normal ostia bilaterally.  ANESTHESIA:   General, paracervical block. INTRAVENOUS FLUIDS:  600 ml of LR FLUID DEFICITS:  40ml of Glycine ESTIMATED BLOOD LOSS:  Less than 20 ml SPECIMENS: Endometrial curettings sent to pathology COMPLICATIONS:  None immediate.  PROCEDURE DETAILS:  The patient received intravenous antibiotics while in the preoperative area.  She was then taken to the operating room where general anesthesia was administered and was found to be adequate.  After an adequate timeout was performed, she was placed in the dorsal lithotomy position and examined; then prepped and draped in the sterile manner.   Her bladder was catheterized for an unmeasured amount of clear, yellow urine. A speculum was then placed in the patient's vagina and a single tooth tenaculum was applied to the anterior lip of the cervix.   A paracervical block using 10 ml of 0.5% Marcaine was administered.  The uterus was sounded to 8 cm and dilated manually with Hagar dilators to accommodate the 5 mm diagnostic hysteroscope.  Once the cervix was dilated, the hysteroscope was inserted under direct visualization using glycine as a suspension medium.  The  uterine cavity was carefully examined, both ostia were recognized, and diffusely proliferative endometrium was noted.   After further careful visualization of the uterine cavity, the hysteroscope was removed under direct visualization.  A sharp curettage was then performed to obtain a moderate amount of endometrial curettings.  The tenaculum was removed from the anterior lip of the cervix and the vaginal speculum was removed after noting good hemostasis.  The patient tolerated the procedure well and was taken to the recovery area awake, extubated and in stable condition.

## 2011-11-12 NOTE — Anesthesia Procedure Notes (Signed)
Procedure Name: LMA Insertion Date/Time: 11/12/2011 2:09 PM Performed by: Tamrah Victorino, Jannet Askew Pre-anesthesia Checklist: Patient identified, Timeout performed, Emergency Drugs available, Suction available and Patient being monitored Patient Re-evaluated:Patient Re-evaluated prior to inductionOxygen Delivery Method: Circle system utilized Preoxygenation: Pre-oxygenation with 100% oxygen Intubation Type: IV induction Ventilation: Mask ventilation without difficulty LMA: LMA inserted LMA Size: 4.0 Number of attempts: 1 Placement Confirmation: positive ETCO2 and breath sounds checked- equal and bilateral Dental Injury: Teeth and Oropharynx as per pre-operative assessment

## 2011-11-12 NOTE — Anesthesia Postprocedure Evaluation (Signed)
  Anesthesia Post-op Note  Patient: Shirley Savage  Procedure(s) Performed: Procedure(s) (LRB): DILATATION AND CURETTAGE /HYSTEROSCOPY (N/A)  Patient Location: PACU  Anesthesia Type: General  Level of Consciousness: awake, alert  and oriented  Airway and Oxygen Therapy: Patient Spontanous Breathing  Post-op Pain: none  Post-op Assessment: Post-op Vital signs reviewed, Patient's Cardiovascular Status Stable, Respiratory Function Stable, Patent Airway, No signs of Nausea or vomiting and Pain level controlled  Post-op Vital Signs: Reviewed and stable  Complications: No apparent anesthesia complications

## 2011-11-12 NOTE — Transfer of Care (Signed)
Immediate Anesthesia Transfer of Care Note  Patient: Shirley Savage  Procedure(s) Performed: Procedure(s) (LRB): DILATATION AND CURETTAGE /HYSTEROSCOPY (N/A)  Patient Location: PACU  Anesthesia Type: General  Level of Consciousness: awake and alert   Airway & Oxygen Therapy: Patient Spontanous Breathing  Post-op Assessment: Report given to PACU RN  Post vital signs: Reviewed and stable  Complications: No apparent anesthesia complications

## 2011-11-13 NOTE — Addendum Note (Signed)
Addendum  created 11/13/11 1240 by Armanda Heritage, RN   Modules edited:Anesthesia Events, Charges VN

## 2011-11-13 NOTE — Addendum Note (Signed)
Addendum  created 11/13/11 1240 by Mays Paino M Danne Vasek, RN   Modules edited:Anesthesia Events, Charges VN    

## 2011-11-30 ENCOUNTER — Encounter: Payer: Self-pay | Admitting: Obstetrics and Gynecology

## 2011-11-30 ENCOUNTER — Ambulatory Visit (INDEPENDENT_AMBULATORY_CARE_PROVIDER_SITE_OTHER): Payer: Self-pay | Admitting: Obstetrics and Gynecology

## 2011-11-30 VITALS — BP 125/81 | HR 76 | Temp 99.1°F | Ht 62.0 in | Wt 240.6 lb

## 2011-11-30 DIAGNOSIS — Z09 Encounter for follow-up examination after completed treatment for conditions other than malignant neoplasm: Secondary | ICD-10-CM

## 2011-11-30 NOTE — Progress Notes (Signed)
Patient ID: Shirley Savage, female   DOB: 06-Mar-1966, 46 y.o.   MRN: [id number] 46 yo G3P3 with abnormal uterine bleeding s/p D&C on 11/12/2011 presenting today for post-op check. Patient reports since the procedure some light spotting. She denies cramping pains, fever, chills, or abnormal vaginal bleeding  GENERAL: Well-developed, well-nourished female in no acute distress.  HEENT: Normocephalic, atraumatic. Sclerae anicteric.  NECK: Supple. Normal thyroid.  LUNGS: Clear to auscultation bilaterally.  HEART: Regular rate and rhythm. ABDOMEN: Soft, nontender, nondistended. No organomegaly. PELVIC: Normal external female genitalia. Vagina is pink and rugated.  Normal discharge. Normal appearing cervix. Uterus is normal in size. No adnexal mass or tenderness. EXTREMITIES: No cyanosis, clubbing, or edema, 2+ distal pulses.  A/P 46 yo G3P3 s/p D&C for abnormal uterine bleeding - Patient medically cleared to resume all activities of daily living - Patient advised to keep a menstrual calendar - RTC for annual exam or prn

## 2011-12-14 ENCOUNTER — Ambulatory Visit: Payer: Self-pay | Admitting: Obstetrics and Gynecology

## 2012-01-04 ENCOUNTER — Ambulatory Visit: Payer: Self-pay

## 2012-01-04 ENCOUNTER — Telehealth: Payer: Self-pay | Admitting: Medical

## 2012-01-04 NOTE — Telephone Encounter (Signed)
Returned pt's call and discussed her concern. She states that she has been taking Depo Provera for 6 yrs to control abnormal uterine bleeding. She has had BTL and does not need this medication for contraception. Since she recently had a D&C for irregular bleeding, she would like to wait awhile longer to see what her bleeding pattern will be now.  She is hoping not to need any medication to control bleeding. She has not had a period since her D&C procedure. I acknowledged pt's comments. I advised her to keep a menstrual calendar as recommended by Dr. Jolayne Panther. She should call for clinic appt if she begins to have heavy bleeding and desires to formulate new plan of care. Pt voiced understanding.

## 2012-01-04 NOTE — Telephone Encounter (Signed)
Patient called stating that she has cancelled appointment for Depo today. She was scheduled for Depo s/p SAB but does not want depo and would like to discuss with a nurse.

## 2012-06-13 IMAGING — US US ABDOMEN COMPLETE
1 series · 14 of 25 positions shown · non-contrast
Comparison: None.

CLINICAL DATA: Abdominal pain.

COMPLETE ABDOMINAL ULTRASOUND

[Series 1: us abdomen complete · 0.31mm/px · 14 of 83 slices shown]
[im 1/83]
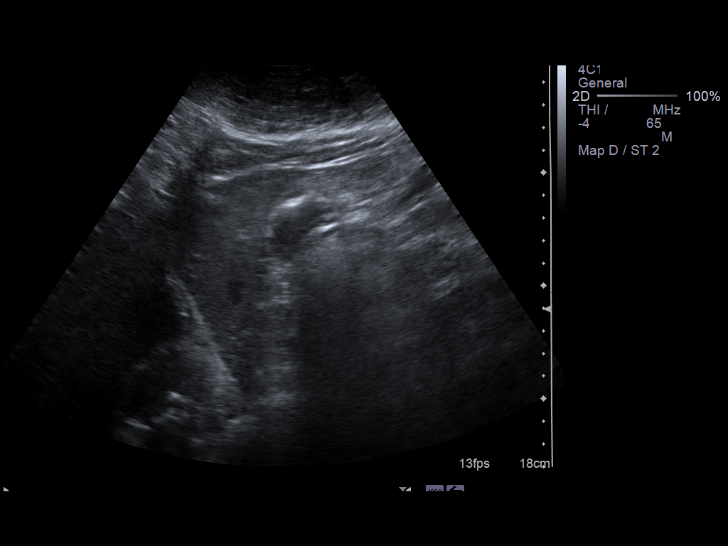
[im 7/83]
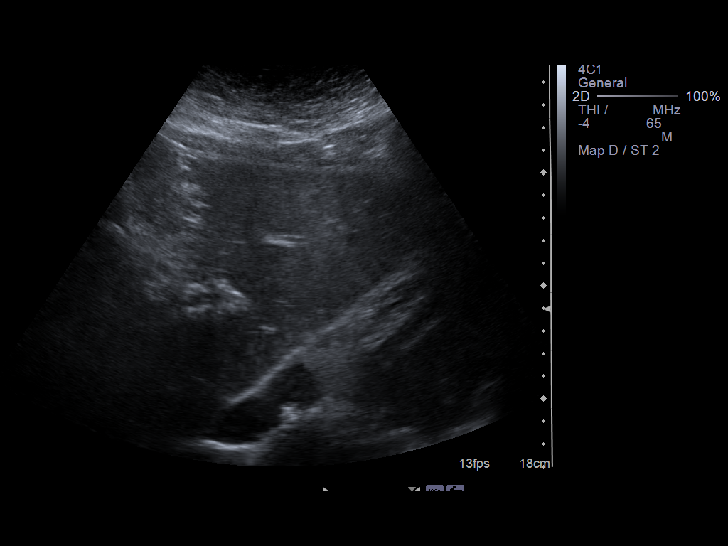
[im 14/83]
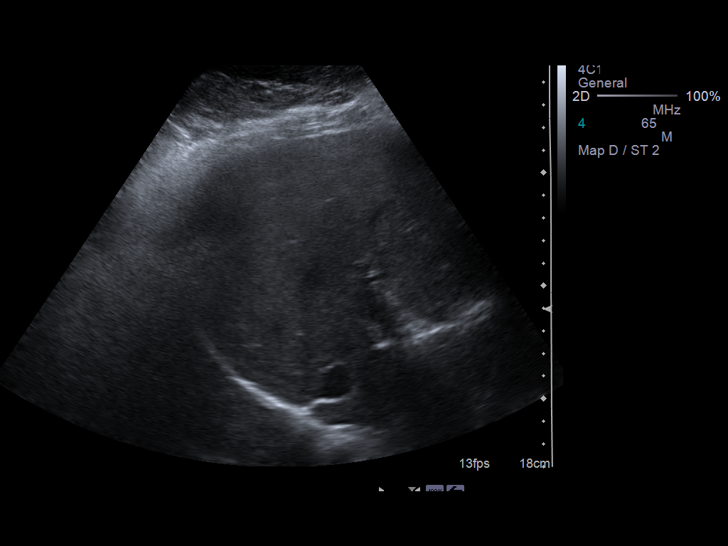
[im 21/83]
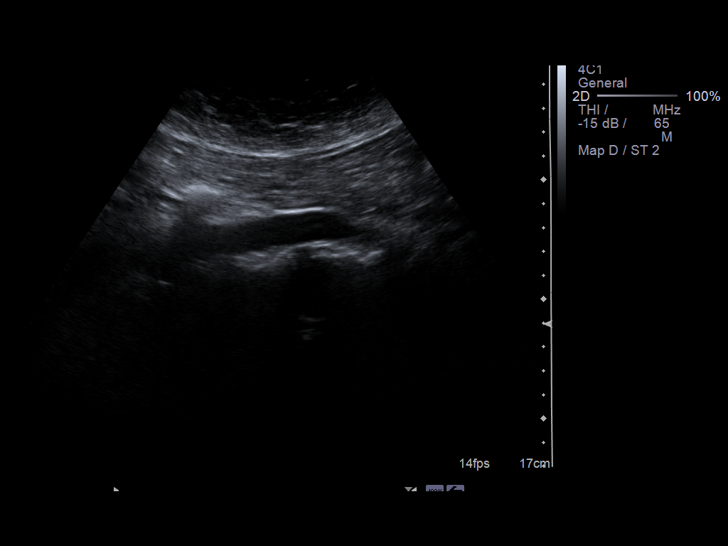
[im 28/83]
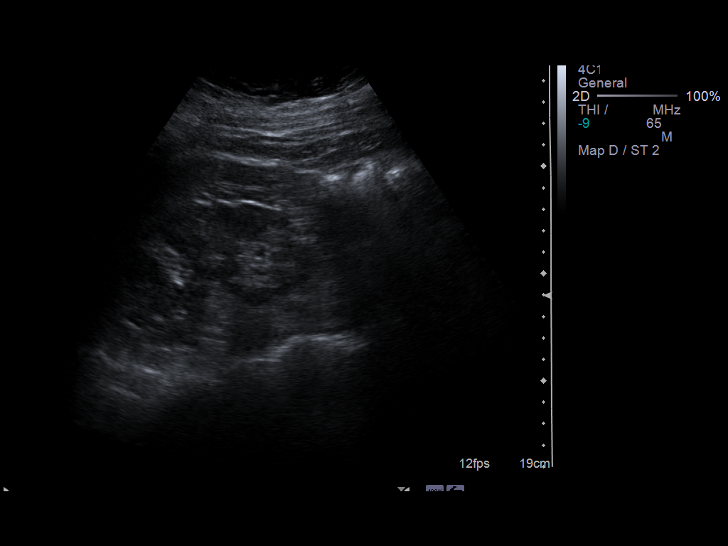
[im 31/83]
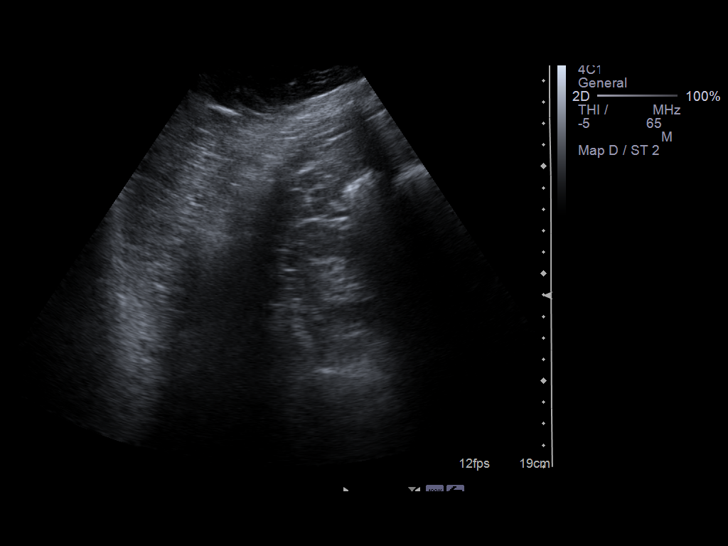
[im 38/83]
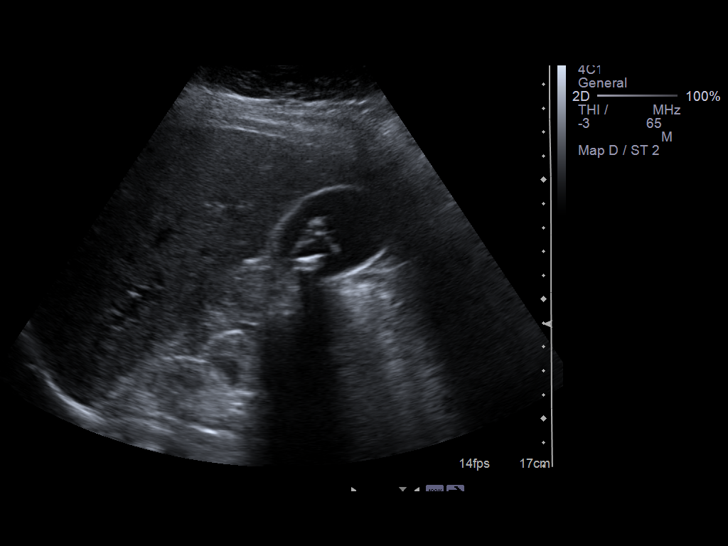
[im 45/83]
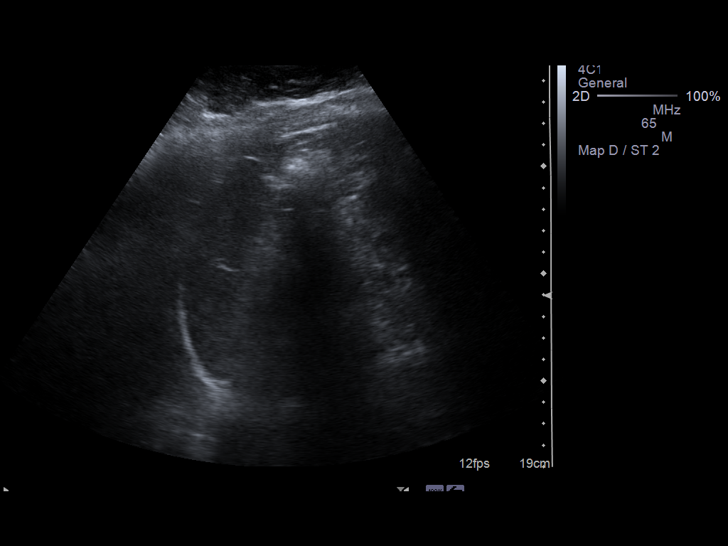
[im 52/83]
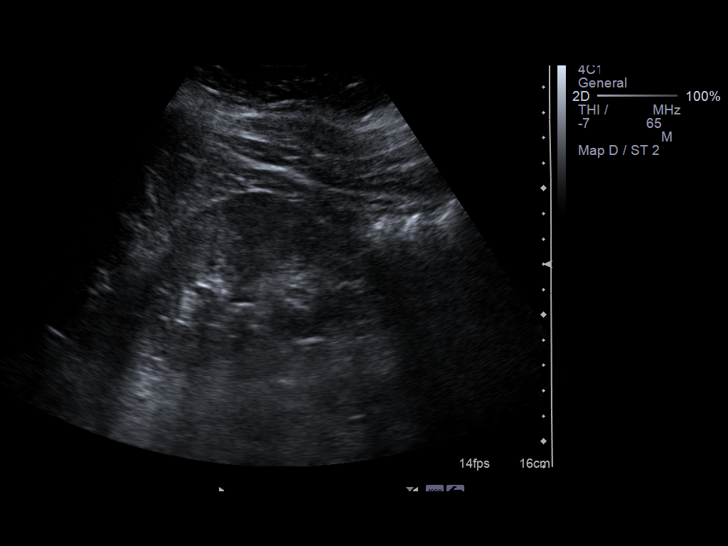
[im 55/83]
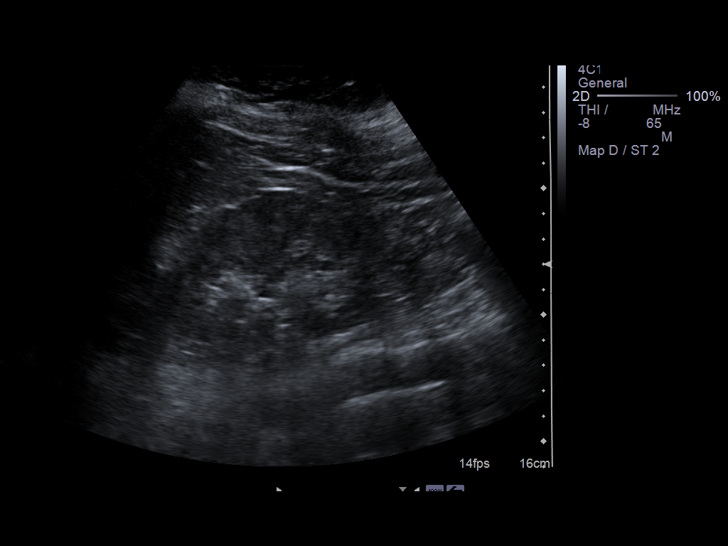
[im 62/83]
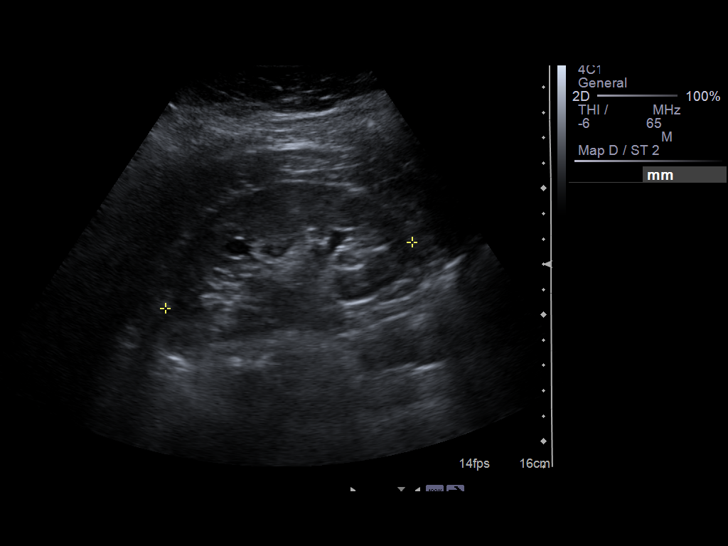
[im 69/83]
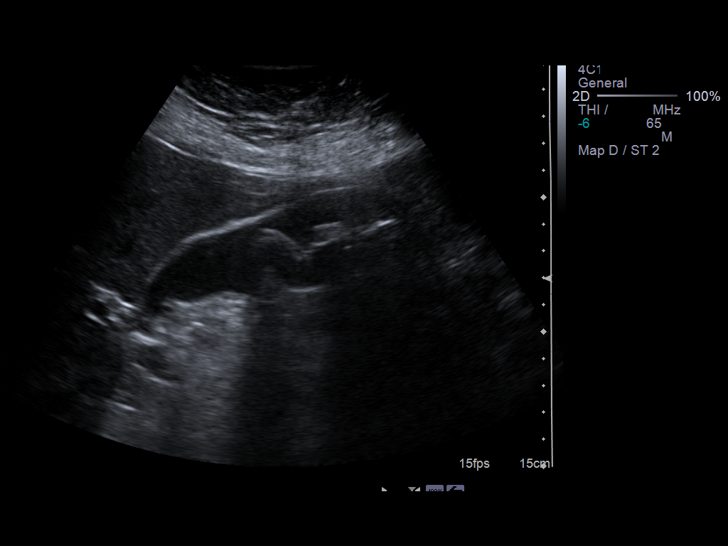
[im 76/83]
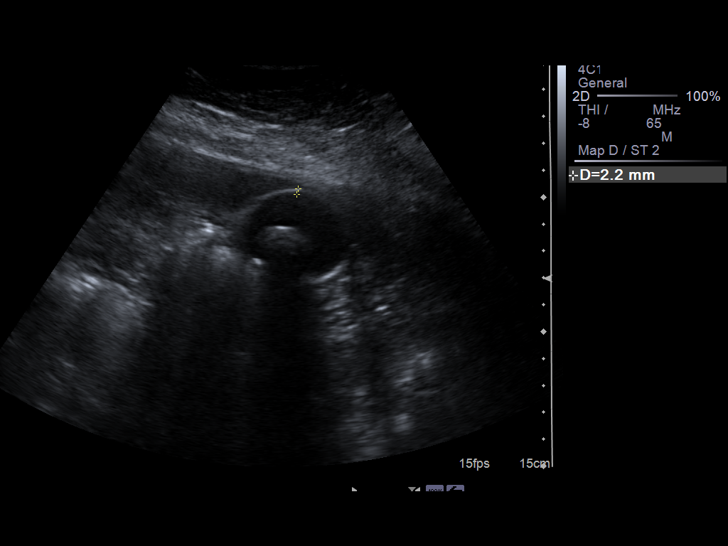
[im 83/83]
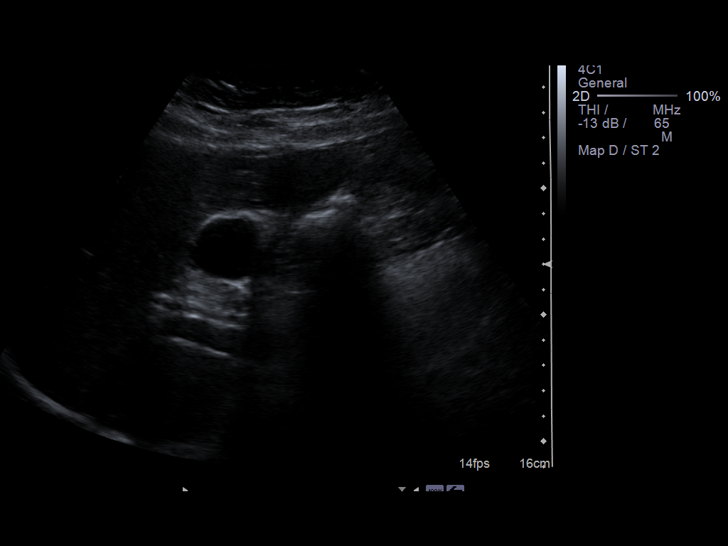

[14 of 25 positions shown; findings below may reference images not displayed]

FINDINGS: Gallbladder:  Multiple gallstones are identified.  Gallbladder wall
measures 2.1 - 3.9 mm.  No pericholecystic fluid.  No sonographic
Murphy's sign.  Largest stone measures 2.1 cm.

Common bile duct:  Measures 3.4 mm.

Liver:  No focal lesion identified.  Within normal limits in
parenchymal echogenicity.

IVC:  Appears normal.

Pancreas:  No focal abnormality seen.

Spleen:  The spleen has a normal appearance and measures 5.3 cm.

Right Kidney:  Right kidney is the right kidney is 10.1 cm.  Upper
pole cyst is 1.0 x 0.8 x 1.0 cm.  No hydronephrosis or solid mass.

Left Kidney:  The left kidney is 10.4 cm in length.  No
hydronephrosis or mass.

Abdominal aorta:  The abdominal aorta is not aneurysmal, max 204
cm.
IMPRESSION: 1.  Multiple gallstones and focal areas of gallbladder wall
thickening.
2.  No sonographic Murphy's sign.
3.  No hydronephrosis.
4.  Right renal cyst.

## 2012-07-04 ENCOUNTER — Encounter (INDEPENDENT_AMBULATORY_CARE_PROVIDER_SITE_OTHER): Payer: Self-pay | Admitting: Ophthalmology

## 2013-06-24 IMAGING — US US TRANSVAGINAL NON-OB
1 series · 13 of 25 positions shown · non-contrast
Comparison: None.

CLINICAL DATA: Heavy vaginal bleeding for past month.  On Depo-
Provera.

TRANSABDOMINAL AND TRANSVAGINAL ULTRASOUND OF PELVIS
TECHNIQUE: Both transabdominal and transvaginal ultrasound
examinations of the pelvis were performed..  Transabdominal
technique was performed for global imaging of the pelvis including
uterus, ovaries, adnexal regions, and pelvic cul-de-sac.
It was necessary to proceed with endovaginal exam following the
transabdominal exam to visualize the endometrium and ovaries.

[Series 1: us pelvis complete · 13 of 52 slices shown]
[im 1/52]
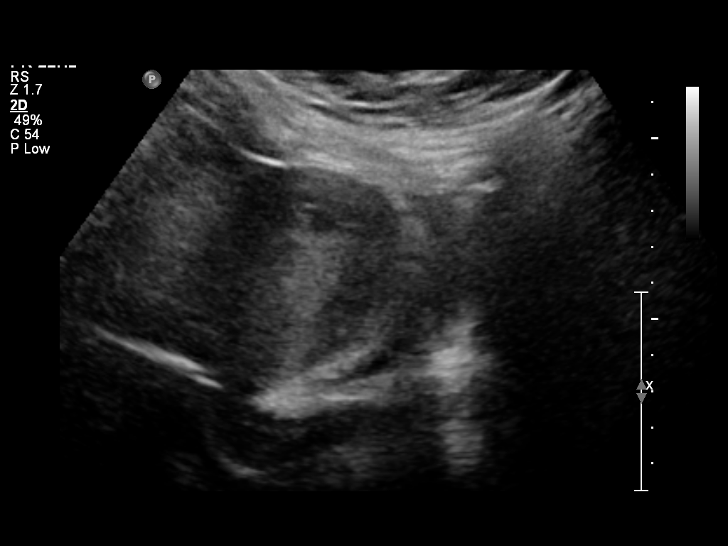
[im 5/52]
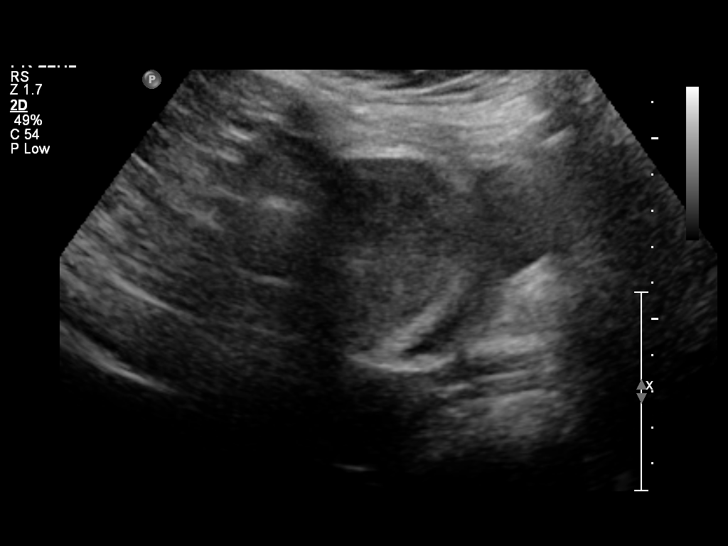
[im 9/52]
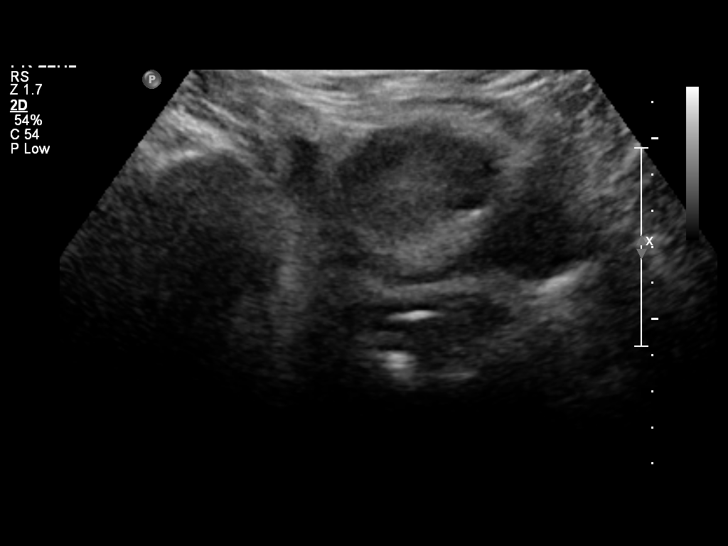
[im 13/52]
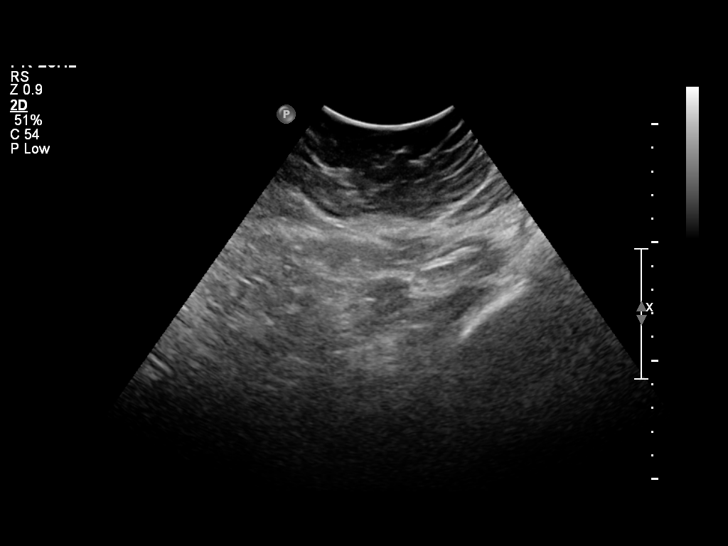
[im 18/52]
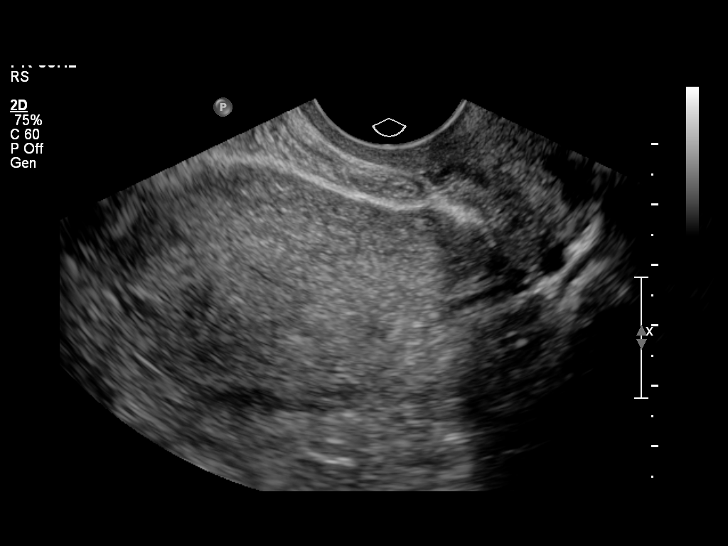
[im 22/52]
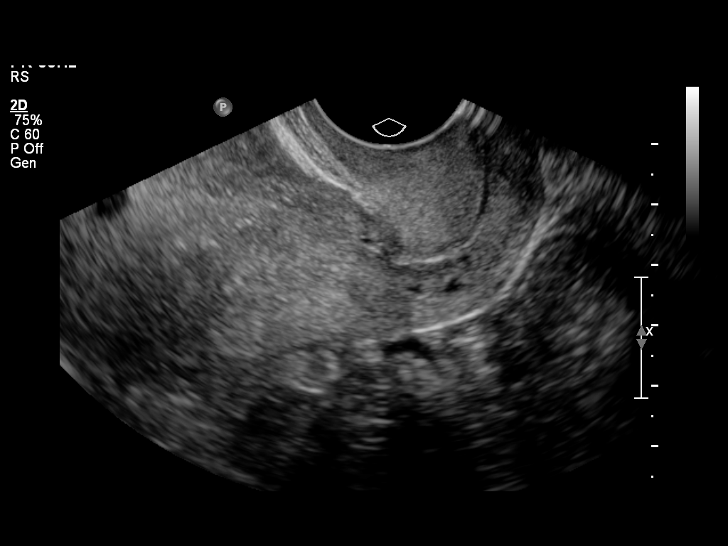
[im 26/52]
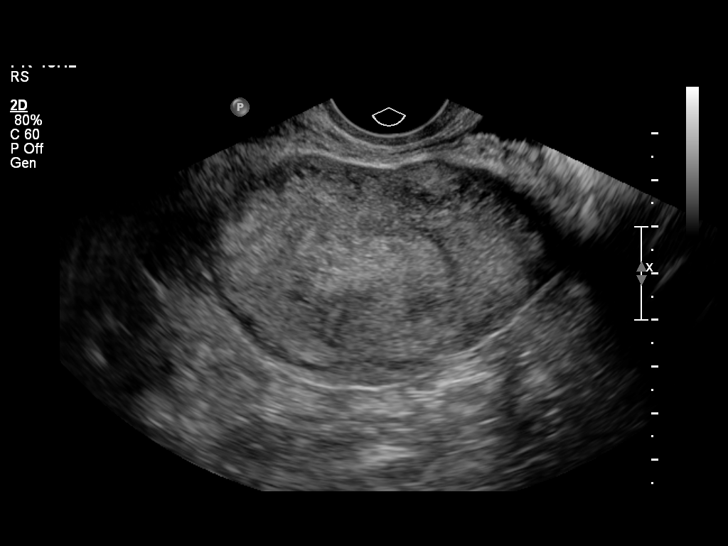
[im 30/52]
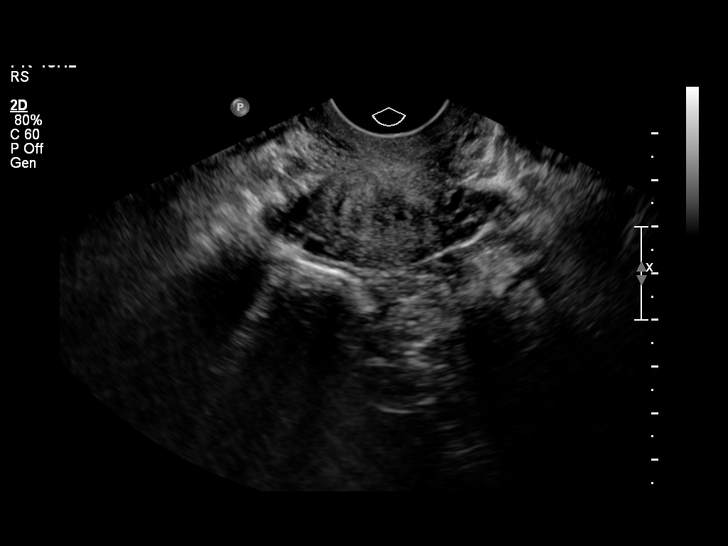
[im 35/52]
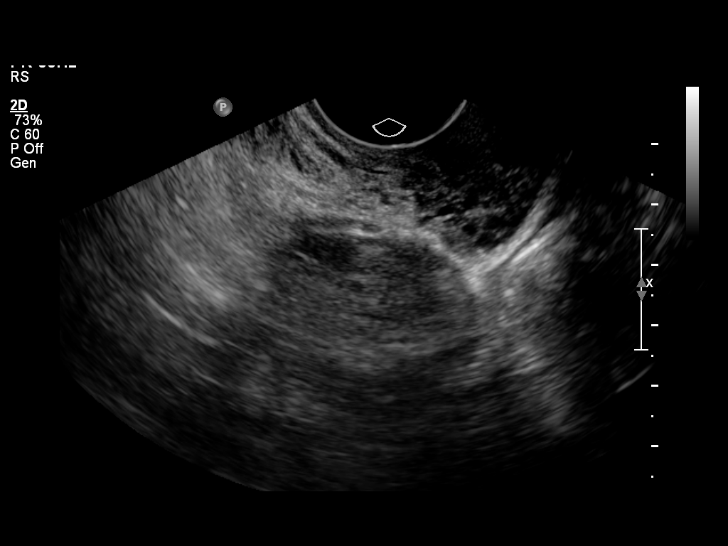
[im 39/52]
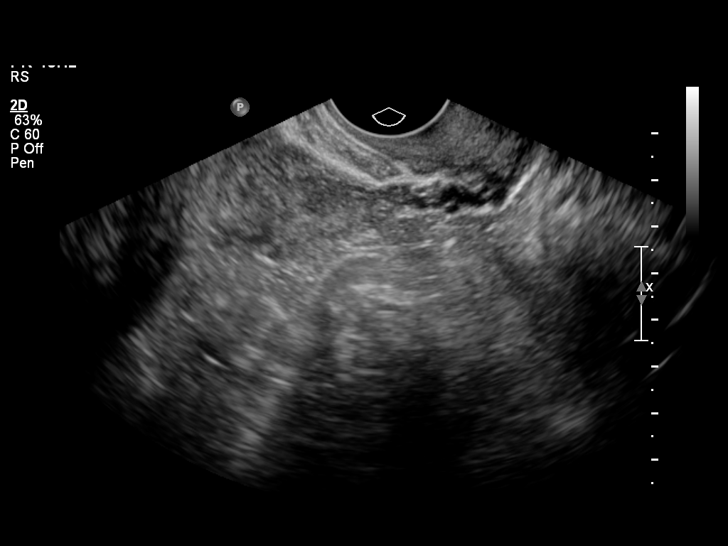
[im 43/52]
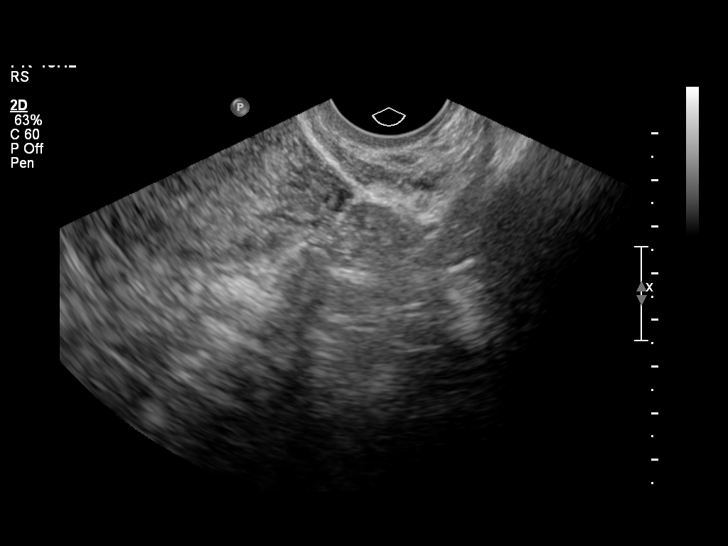
[im 47/52]
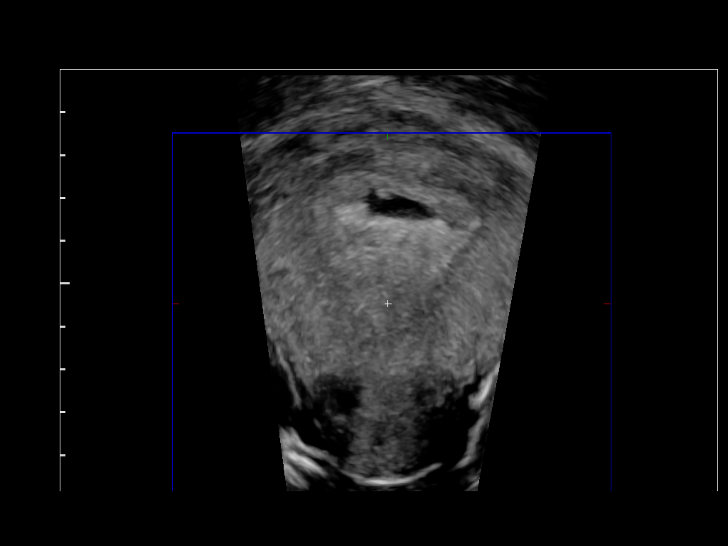
[im 52/52]
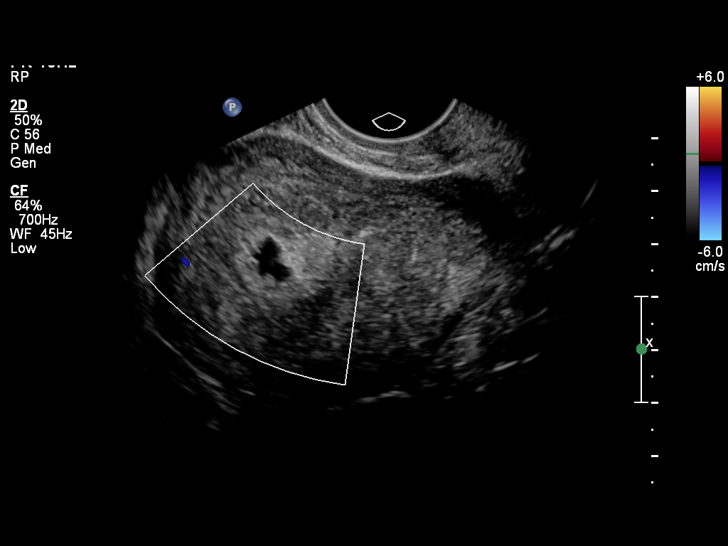

[13 of 25 positions shown; findings below may reference images not displayed]

FINDINGS: Uterus:  9.0 x 5.3 x 7.3 cm.  No fibroids identified.

Endometrium: A single irregular fluid collection is seen in the
fundal portion of the endometrial cavity which measures 12 mm, and
is nonspecific.  This could represent blood products, cystic
endometrial polyp, or abnormal intrauterine gestational sac.

Right ovary: 3.5 x 1.8 x 2.1 cm.  Normal appearance.

Left ovary: not directly visualized by transabdominal or
transvaginal sonography, however no adnexal mass identified.

Other Findings:  No free fluid
IMPRESSION: 1.  Single 12 mm irregular fluid collection in the fundal portion
of the endometrial cavity, which is nonspecific.  Differential
diagnosis includes blood products, cystic endometrial polyp, and
abnormal intrauterine gestational sac.  Recommend correlation with
quantitative beta HCG level.
2.  No adnexal mass or free fluid identified.

## 2014-02-05 ENCOUNTER — Encounter: Payer: Self-pay | Admitting: Obstetrics and Gynecology

## 2014-10-26 ENCOUNTER — Other Ambulatory Visit (HOSPITAL_COMMUNITY): Payer: Self-pay | Admitting: Primary Care

## 2014-10-26 DIAGNOSIS — Z1231 Encounter for screening mammogram for malignant neoplasm of breast: Secondary | ICD-10-CM

## 2014-11-06 ENCOUNTER — Ambulatory Visit (HOSPITAL_COMMUNITY)
Admission: RE | Admit: 2014-11-06 | Discharge: 2014-11-06 | Disposition: A | Discharge status: Home / Self Care | Payer: 59 | Source: Ambulatory Visit | Attending: Primary Care | Admitting: Primary Care

## 2014-11-06 DIAGNOSIS — Z1231 Encounter for screening mammogram for malignant neoplasm of breast: Secondary | ICD-10-CM

## 2014-11-13 ENCOUNTER — Other Ambulatory Visit: Payer: Self-pay | Admitting: Primary Care

## 2014-11-13 DIAGNOSIS — N644 Mastodynia: Secondary | ICD-10-CM

## 2014-11-13 DIAGNOSIS — N63 Unspecified lump in unspecified breast: Secondary | ICD-10-CM

## 2014-12-04 ENCOUNTER — Ambulatory Visit
Admission: RE | Admit: 2014-12-04 | Discharge: 2014-12-04 | Disposition: A | Discharge status: Home / Self Care | Payer: 59 | Source: Ambulatory Visit | Attending: Primary Care | Admitting: Primary Care

## 2014-12-04 DIAGNOSIS — N63 Unspecified lump in unspecified breast: Secondary | ICD-10-CM

## 2014-12-04 DIAGNOSIS — N644 Mastodynia: Secondary | ICD-10-CM

## 2015-11-25 ENCOUNTER — Other Ambulatory Visit: Payer: Self-pay | Admitting: Family Medicine

## 2015-11-25 DIAGNOSIS — R1084 Generalized abdominal pain: Secondary | ICD-10-CM

## 2015-12-04 ENCOUNTER — Ambulatory Visit
Admission: RE | Admit: 2015-12-04 | Discharge: 2015-12-04 | Disposition: A | Discharge status: Home / Self Care | Payer: BLUE CROSS/BLUE SHIELD | Source: Ambulatory Visit | Attending: Family Medicine | Admitting: Family Medicine

## 2015-12-04 DIAGNOSIS — R1084 Generalized abdominal pain: Secondary | ICD-10-CM

## 2016-01-28 ENCOUNTER — Ambulatory Visit (HOSPITAL_COMMUNITY)
Admission: EM | Admit: 2016-01-28 | Discharge: 2016-01-28 | Disposition: A | Discharge status: Home / Self Care | Payer: Self-pay | Attending: Family Medicine | Admitting: Family Medicine

## 2016-01-28 ENCOUNTER — Encounter (HOSPITAL_COMMUNITY): Payer: Self-pay | Admitting: Emergency Medicine

## 2016-01-28 ENCOUNTER — Ambulatory Visit (INDEPENDENT_AMBULATORY_CARE_PROVIDER_SITE_OTHER): Payer: Self-pay

## 2016-01-28 DIAGNOSIS — M25561 Pain in right knee: Secondary | ICD-10-CM | POA: Diagnosis not present

## 2016-01-28 MED ORDER — DICLOFENAC POTASSIUM 50 MG PO TABS: 50.0000 mg | ORAL_TABLET | Freq: Three times a day (TID) | ORAL | 0 refills | Status: DC

## 2016-01-28 NOTE — ED Triage Notes (Signed)
Reports hitting right knee on a table leg two weeks ago.  Patient did this again last Thursday.  Over the weekend, knee swelled and having pain.  Limping to walk.  Reports a shooting pain on the inner knee.  Painful to touch.  Feels burning pain inside knee.

## 2016-01-28 NOTE — Discharge Instructions (Signed)
YOU SHOULD LIMIT WEIGHT BEARING  CONTINUE TO ICE AND KEEP KNEE ELEVATED.   THERE IS AN ABNORMALITY OF YOUR PATELLA. WHICH MAY OR MAY NOT BE A FRACTURE. YOU WILL NEED TO SEE ORTHO FOR DEFINITIVE CARE AND FOLLOW UP.

## 2016-01-28 NOTE — ED Provider Notes (Signed)
CSN: [id number]     Arrival date & time 01/28/16  1101 History   First MD Initiated Contact with Patient 01/28/16 1148     Chief Complaint  Patient presents with  . Knee Pain   (Consider location/radiation/quality/duration/timing/severity/associated sxs/prior Treatment) HPI NP 50 Y/O FEMALE STRUCK KNEE ON METAL SUPPORT OF TABLE 2 WEEKS AGO. STRUCK KNEE A SECOND TIME LAST Thursday, NOW HAVING MORE PAIN, SWELLING, WITH A BURNING SENSATION. STATES AT END OF DAY KNEE IS SWOLLEN.  Past Medical History:  Diagnosis Date  . Anemia   . Fibroids   . Obesity    Past Surgical History:  Procedure Laterality Date  . CESAREAN SECTION    . CHOLECYSTECTOMY    . HYSTEROSCOPY W/D&C  11/12/2011   Procedure: DILATATION AND CURETTAGE /HYSTEROSCOPY;  Surgeon: Mora Bellman, MD;  Location: [facility];  Service: Gynecology;  Laterality: N/A;  . TUBAL LIGATION    . WISDOM TOOTH EXTRACTION     Family History  Problem Relation Age of Onset  . Anesthesia problems Neg Hx    Social History  Substance Use Topics  . Smoking status: Never Smoker  . Smokeless tobacco: Never Used  . Alcohol use Yes     Comment: once a month; wine or vodka drink very occasionally   OB History    Gravida Para Term Preterm AB Living   3 3 3  0 0 3   SAB TAB Ectopic Multiple Live Births   0 0 0         Review of Systems  Denies: HEADACHE, NAUSEA, ABDOMINAL PAIN, CHEST PAIN, CONGESTION, DYSURIA, SHORTNESS OF BREATH  Allergies  Review of patient's allergies indicates no known allergies.  Home Medications   Prior to Admission medications   Medication Sig Start Date End Date Taking? Authorizing Provider  Cholecalciferol (VITAMIN D HIGH POTENCY PO) Take by mouth.   Yes Historical Provider, MD  ferrous sulfate (FERROUSUL) 325 (65 FE) MG tablet Take 1 tablet (325 mg total) by mouth 2 (two) times daily. 10/15/11 01/28/16 Yes Peggy Constant, MD  ibuprofen (ADVIL,MOTRIN) 200 MG tablet Take 600 mg by mouth every 6 (six) hours as  needed. For pain   Yes Historical Provider, MD  medroxyPROGESTERone (DEPO-PROVERA) 150 MG/ML injection Inject 150 mg into the muscle every 3 (three) months.    Historical Provider, MD   Meds Ordered and Administered this Visit  Medications - No data to display  BP 137/60 (BP Location: Left Arm) Comment (BP Location): large cuff  Pulse 71   Temp 98.7 F (37.1 C) (Oral)   Resp 20   SpO2 100%  No data found.   Physical Exam NURSES NOTES AND VITAL SIGNS REVIEWED. CONSTITUTIONAL: Well developed, well nourished, no acute distress HEENT: normocephalic, atraumatic EYES: Conjunctiva normal NECK:normal ROM, supple, no adenopathy PULMONARY:No respiratory distress, normal effort ABDOMINAL: Soft, ND, NT BS+, No CVAT MUSCULOSKELETAL: Normal ROM of all extremities, RIGHT KNEE IS SWOLLEN, TENDER MEDIALLY. JOINT IS STABLE TO VALGUS AND VARUS STRESS, PATELLA TENDONS ARE INTACT.  SKIN: warm and dry without rash PSYCHIATRIC: Mood and affect, behavior are normal  Urgent Care Course   Clinical Course    Procedures (including critical care time)  Labs Review Labs Reviewed - No data to display  Imaging Review Dg Knee Complete 4 Views Right  Result Date: 01/28/2016 CLINICAL DATA:  Hit right knee on a table 1 week ago. Continued pain. EXAM: RIGHT KNEE - COMPLETE 4+ VIEW COMPARISON:  None. 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 Recommend clinical correlation for pain to completely exclude lateral patellar fracture. Mild degenerative changes in the right knee with joint space narrowing and spurring, most pronounced in the patellofemoral compartment. No joint effusion. IMPRESSION: Irregularity noted laterally along the patella, favor bipartite patella. Recommend clinical correlation to completely exclude patellar fracture laterally. Degenerative changes. Electronically Signed   By: Rolm Baptise M.D.   On: 01/28/2016 12:08     Visual Acuity Review  Right  Eye Distance:   Left Eye Distance:   Bilateral Distance:    Right Eye Near:   Left Eye Near:    Bilateral Near:         MDM   1. Acute pain of right knee     Patient is reassured that there are no issues that require transfer to higher level of care at this time or additional tests. Patient is advised to continue home symptomatic treatment. Patient is advised that if there are new or worsening symptoms to attend the emergency department, contact primary care provider, or return to UC. Instructions of care provided discharged home in stable condition.    THIS NOTE WAS GENERATED USING A VOICE RECOGNITION SOFTWARE PROGRAM. ALL REASONABLE EFFORTS  WERE MADE TO PROOFREAD THIS DOCUMENT FOR ACCURACY.  I have verbally reviewed the discharge instructions with the patient. A printed AVS was given to the patient.  All questions were answered prior to discharge.      Konrad Felix, PA 01/28/16 1226

## 2016-08-07 ENCOUNTER — Emergency Department (HOSPITAL_COMMUNITY)
Admission: EM | Admit: 2016-08-07 | Discharge: 2016-08-08 | Disposition: A | Discharge status: Home / Self Care | Payer: Self-pay | Attending: Emergency Medicine | Admitting: Emergency Medicine

## 2016-08-07 ENCOUNTER — Encounter (HOSPITAL_COMMUNITY): Payer: Self-pay | Admitting: Nurse Practitioner

## 2016-08-07 DIAGNOSIS — Y939 Activity, unspecified: Secondary | ICD-10-CM | POA: Insufficient documentation

## 2016-08-07 DIAGNOSIS — W19XXXA Unspecified fall, initial encounter: Secondary | ICD-10-CM

## 2016-08-07 DIAGNOSIS — Z79899 Other long term (current) drug therapy: Secondary | ICD-10-CM | POA: Insufficient documentation

## 2016-08-07 DIAGNOSIS — Y999 Unspecified external cause status: Secondary | ICD-10-CM | POA: Insufficient documentation

## 2016-08-07 DIAGNOSIS — W010XXA Fall on same level from slipping, tripping and stumbling without subsequent striking against object, initial encounter: Secondary | ICD-10-CM | POA: Insufficient documentation

## 2016-08-07 DIAGNOSIS — M25561 Pain in right knee: Secondary | ICD-10-CM | POA: Insufficient documentation

## 2016-08-07 DIAGNOSIS — Y929 Unspecified place or not applicable: Secondary | ICD-10-CM | POA: Insufficient documentation

## 2016-08-07 NOTE — ED Notes (Signed)
Bed: WA11 Expected date:  Expected time:  Means of arrival:  Comments: Ems 

## 2016-08-07 NOTE — ED Triage Notes (Signed)
Pt is brought in by EMS, reportedly fell at a grocery store after sliding over a wet surface. C/o right knee pain 5/10, mild swelling without deformity noted. Offered ice.

## 2016-08-08 ENCOUNTER — Emergency Department (HOSPITAL_COMMUNITY): Payer: Self-pay

## 2016-08-08 MED ORDER — HYDROCODONE-ACETAMINOPHEN 5-325 MG PO TABS: 1.0000 | ORAL_TABLET | Freq: Four times a day (QID) | ORAL | 0 refills | Status: DC | PRN

## 2016-08-08 MED ORDER — HYDROCODONE-ACETAMINOPHEN 5-325 MG PO TABS
2.0000 | ORAL_TABLET | Freq: Once | ORAL | Status: AC
  Administered 2016-08-08: 2 via ORAL
  Filled 2016-08-08: qty 2

## 2016-08-08 NOTE — ED Provider Notes (Signed)
[facility] DEPT Provider Note   CSN: [id number]  Arrival date & time: 08/07/16  2322  By signing my name below, I, Ny'Kea Lewis, attest that this documentation has been prepared under the direction and in the presence of Montine Circle, PA-C. Electronically Signed: Lise Auer, ED Scribe. 08/08/16. 1:02 AM.  History   Chief Complaint Chief Complaint  Patient presents with  . Knee Pain    Right Knee  . Fall   The history is provided by the patient. No language interpreter was used.  Fall    HPI Comments: Shirley Savage is a 51 y.o. female with a PMHx of anemia and fibroids, brought in by ambulance, who presents to the Emergency Department complaining of a sudden onset of right knee pain s/p fall PTA. Per nurse's triage notes pt slipped and fell in a wet surface whille in the store, when she states she heard a "pop" and landed on her right knee. No alleviating factors or treatment tried PTA. Pt notes she is currently followed by an orthopedic at [place]. Denies any acute associated symptoms at this time.    Past Medical History:  Diagnosis Date  . Anemia   . Fibroids   . Obesity     Patient Active Problem List   Diagnosis Date Noted  . LABYRINTHITIS, ACUTE 03/12/2010  . SINUSITIS, ACUTE 03/12/2010  . UTI 07/24/2009  . MENORRHAGIA 02/07/2009  . ABSCESS, BREAST, RIGHT 11/13/2008  . MUSCLE SPASM, BACK 03/20/2008  . KNEE PAIN, LEFT 07/05/2007  . ANKLE SPRAIN, LEFT 07/05/2007  . DYSURIA 02/10/2007  . UTI'S, HX OF 11/27/2006  . ABDOMINAL PAIN, HX OF 11/27/2006    Past Surgical History:  Procedure Laterality Date  . CESAREAN SECTION    . CHOLECYSTECTOMY    . HYSTEROSCOPY W/D&C  11/12/2011   Procedure: DILATATION AND CURETTAGE /HYSTEROSCOPY;  Surgeon: Mora Bellman, MD;  Location: [facility];  Service: Gynecology;  Laterality: N/A;  . TUBAL LIGATION    . WISDOM TOOTH EXTRACTION      OB History    Gravida Para Term Preterm AB Living   3 3 3  0 0 3   SAB TAB  Ectopic Multiple Live Births   0 0 0           Home Medications    Prior to Admission medications   Medication Sig Start Date End Date Taking? Authorizing Provider  Cholecalciferol (VITAMIN D HIGH POTENCY PO) Take by mouth.    [provider]  diclofenac (CATAFLAM) 50 MG tablet Take 1 tablet (50 mg total) by mouth 3 (three) times daily. 01/28/16   Konrad Felix, PA  ferrous sulfate (FERROUSUL) 325 (65 FE) MG tablet Take 1 tablet (325 mg total) by mouth 2 (two) times daily. 10/15/11 01/28/16  Constant, Peggy, MD  ibuprofen (ADVIL,MOTRIN) 200 MG tablet Take 600 mg by mouth every 6 (six) hours as needed. For pain    [provider]  medroxyPROGESTERone (DEPO-PROVERA) 150 MG/ML injection Inject 150 mg into the muscle every 3 (three) months.    [provider]    Family History Family History  Problem Relation Age of Onset  . Anesthesia problems Neg Hx     Social History Social History  Substance Use Topics  . Smoking status: Never Smoker  . Smokeless tobacco: Never Used  . Alcohol use Yes     Comment: once a month; wine or vodka drink very occasionally     Allergies   Patient has no known allergies.  Review of Systems Review of Systems  All other systems reviewed and are negative.    Physical Exam Updated Vital Signs BP 131/69 (BP Location: Right Arm)   Pulse 86   Temp 98.2 F (36.8 C) (Oral)   Resp 18   Ht 5\' 2"  (1.575 m)   Wt 220 lb (99.8 kg)   LMP 08/04/2016   SpO2 99%   BMI 40.24 kg/m   Physical Exam   Nursing note and vitals reviewed.  Constitutional: Pt appears well-developed and well-nourished. No distress.  HENT:  Head: Normocephalic and atraumatic.  Eyes: Conjunctivae are normal.  Neck: Normal range of motion.  Cardiovascular: Normal rate, regular rhythm. Intact distal pulses.   Capillary refill < 3 sec.  Pulmonary/Chest: Effort normal and breath sounds normal.  Musculoskeletal:  RLE  Pt exhibits TTP to right  knee cap.   ROM: 4/5  Strength: 4/5  Neurological: Pt  is alert. Coordination normal.  Sensation: 5/5 Skin: Skin is warm and dry. Pt is not diaphoretic.  No evidence of open wound or skin tenting Psychiatric: Pt has a normal mood and affect.     ED Treatments / Results   DIAGNOSTIC STUDIES: Oxygen Saturation is 99% on RA, normal by my interpretation.   COORDINATION OF CARE: 12:48 AM-Discussed next steps with pt. Pt verbalized understanding and is agreeable with the plan.    Labs (all labs ordered are listed, but only abnormal results are displayed) Labs Reviewed - No data to display  EKG  EKG Interpretation None       Radiology Dg Knee Complete 4 Views Right  Result Date: 08/08/2016 CLINICAL DATA:  Slipped and fell in grocery store. RIGHT knee pain and swelling. EXAM: RIGHT KNEE - COMPLETE 4+ VIEW COMPARISON:  RIGHT knee radiograph January 28, 2016 FINDINGS: Chronic lucency through lateral aspect of the patella with marginal spurring and enthesopathy. Moderate medial compartment narrowing and marginal spurring consistent with osteoarthrosis. Patella [place] is unchanged. No destructive bony lesions. Soft tissue planes are nonsuspicious. 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 Electronically Signed   By: Elon Alas M.D.   On: 08/08/2016 00:37    Procedures Procedures (including critical care time)  Medications Ordered in ED Medications  HYDROcodone-acetaminophen (NORCO/VICODIN) 5-325 MG per tablet 2 tablet (not administered)     Initial Impression / Assessment and Plan / ED Course  I have reviewed the triage vital signs and the nursing notes.  Pertinent labs & imaging results that were available during my care of the patient were reviewed by me and considered in my medical decision making (see chart for details).     Patient X-Ray negative for obvious fracture or dislocation.  Pt advised to follow up with orthopedics.  Patient given knee brace and crutches while in ED, conservative therapy recommended and discussed. Patient will be discharged home & is agreeable with above plan. Returns precautions discussed. Pt appears safe for discharge.    Final Clinical Impressions(s) / ED Diagnoses   Final diagnoses:  Fall, initial encounter  Acute pain of right knee    New Prescriptions New Prescriptions   HYDROCODONE-ACETAMINOPHEN (NORCO/VICODIN) 5-325 MG TABLET    Take 1-2 tablets by mouth every 6 (six) hours as needed.   I personally performed the services described in this documentation, which was scribed in my presence. The recorded information has been reviewed and is accurate.       Montine Circle, PA-C 08/08/16 Virgel Bouquet    Ripley Fraise, MD 08/08/16 [phone]

## 2016-12-01 DIAGNOSIS — D573 Sickle-cell trait: Secondary | ICD-10-CM | POA: Insufficient documentation

## 2017-02-05 ENCOUNTER — Other Ambulatory Visit: Payer: Self-pay | Admitting: Obstetrics and Gynecology

## 2017-07-13 DIAGNOSIS — M1711 Unilateral primary osteoarthritis, right knee: Secondary | ICD-10-CM | POA: Insufficient documentation

## 2017-10-23 IMAGING — CT CT ABD-PELV W/O CM
2 of 4 series · 16 of 46 positions shown, 18 images · non-contrast
Comparison: CT abdomen pelvis of 11/07/2010

CLINICAL DATA: Diffuse abdominal pain for 3 years, some right rib
pain

EXAM:
CT ABDOMEN AND PELVIS WITHOUT CONTRAST
TECHNIQUE: Multidetector CT imaging of the abdomen and pelvis was performed
following the standard protocol without IV contrast.

[Series 2: abd/pelvis w/(date) · axial · 0.75mm/px · z∈[+224,+664]mm · 13 of 96 slices shown, 15 images]
[im 4/96  soft-tissue]
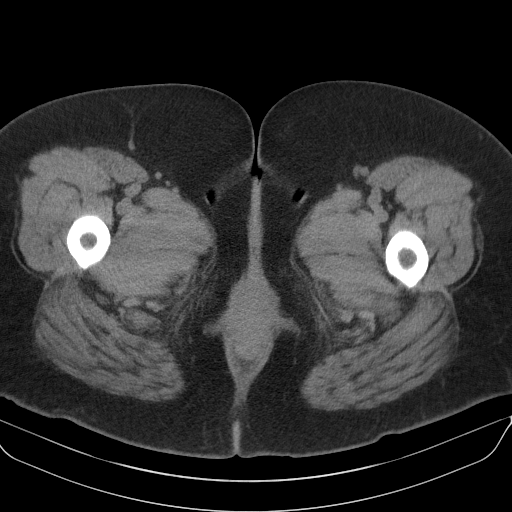
[im 4/96  bone]
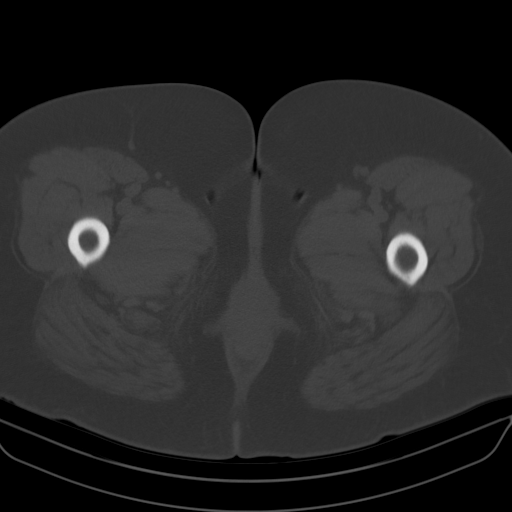
[im 12/96  soft-tissue]
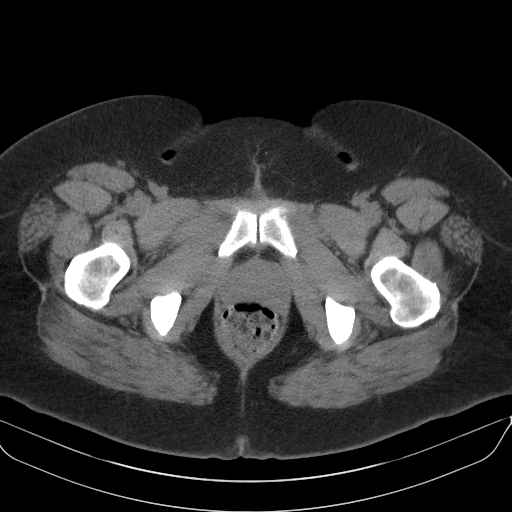
[im 20/96  soft-tissue]
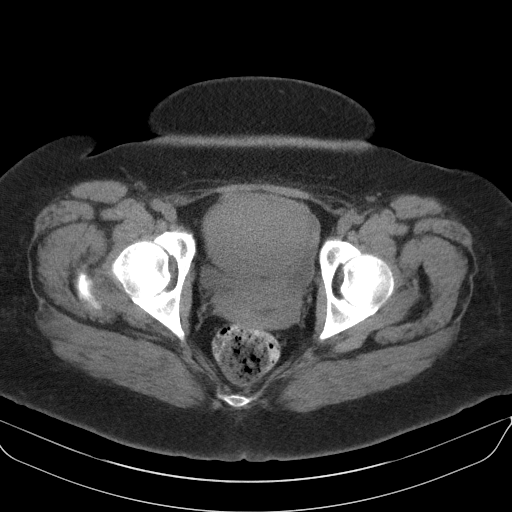
[im 27/96  soft-tissue]
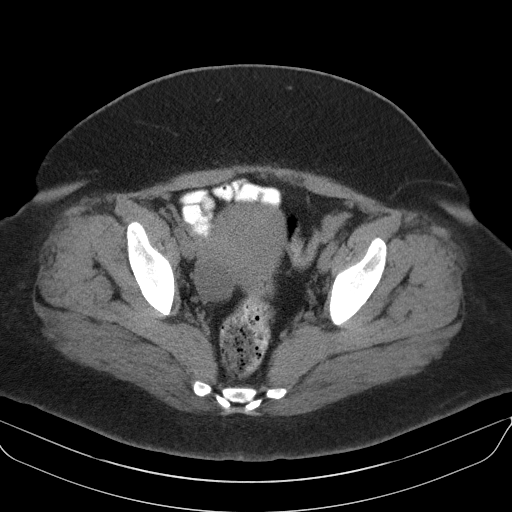
[im 35/96  soft-tissue]
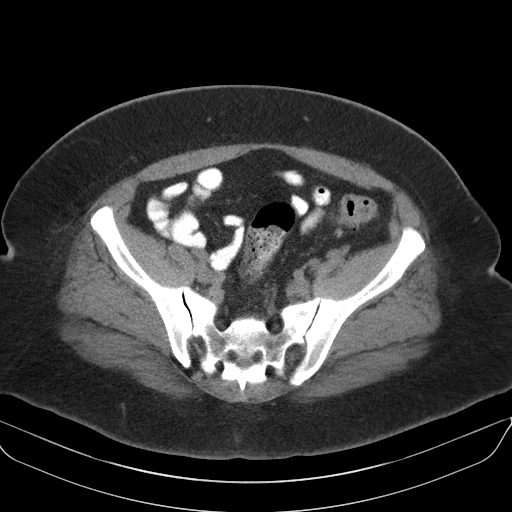
[im 42/96  soft-tissue]
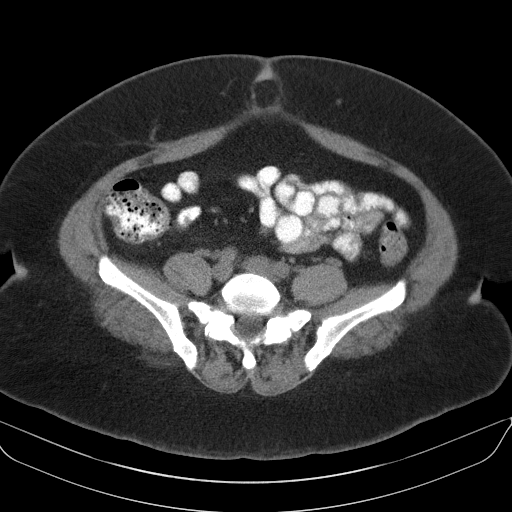
[im 50/96  soft-tissue]
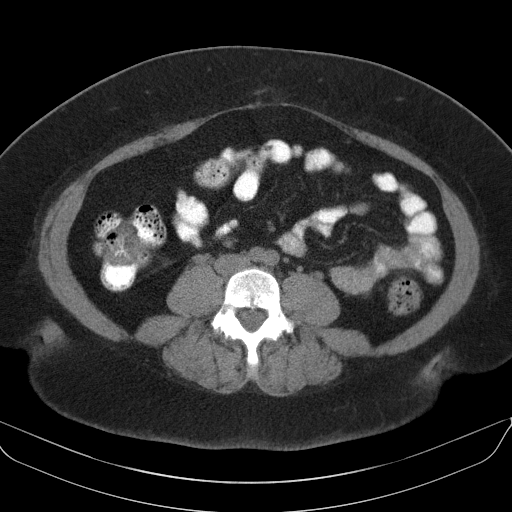
[im 54/96  soft-tissue]
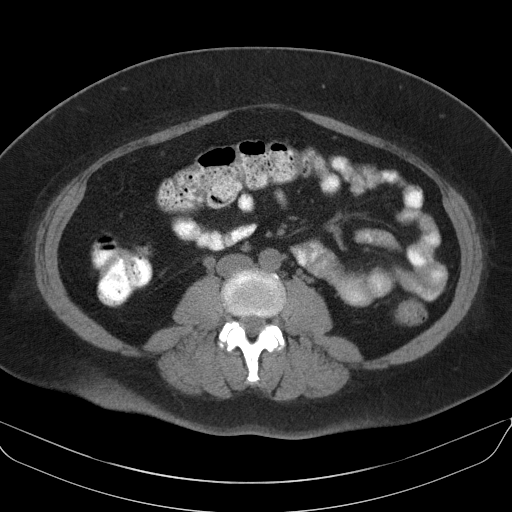
[im 61/96  soft-tissue]
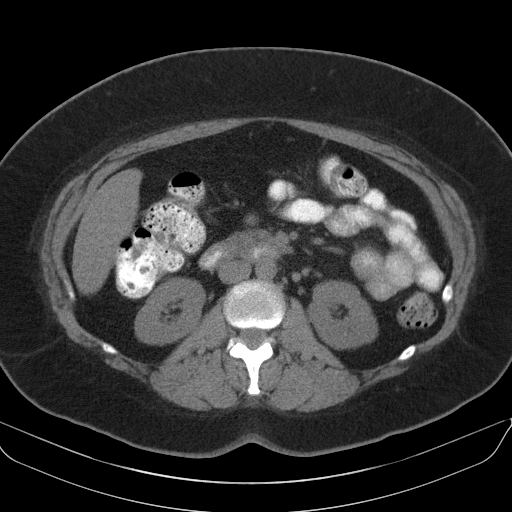
[im 61/96  bone]
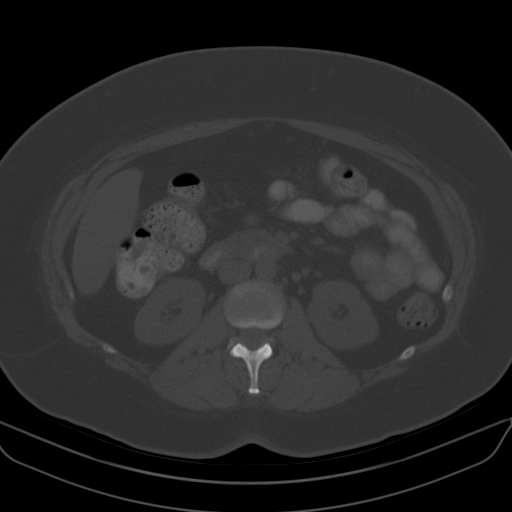
[im 69/96  soft-tissue]
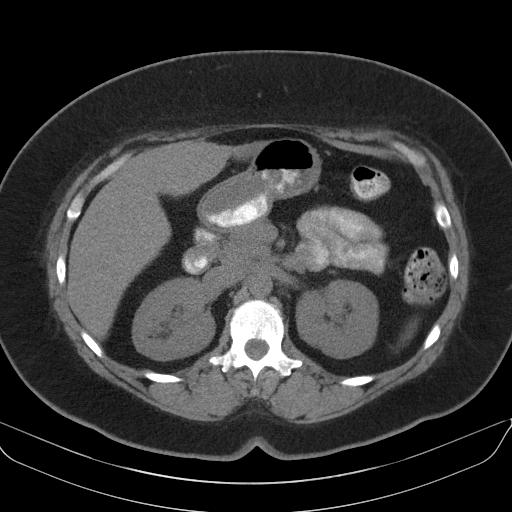
[im 77/96  soft-tissue]
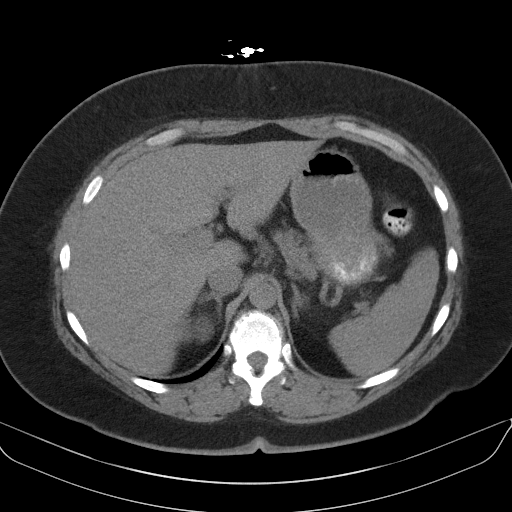
[im 84/96  soft-tissue]
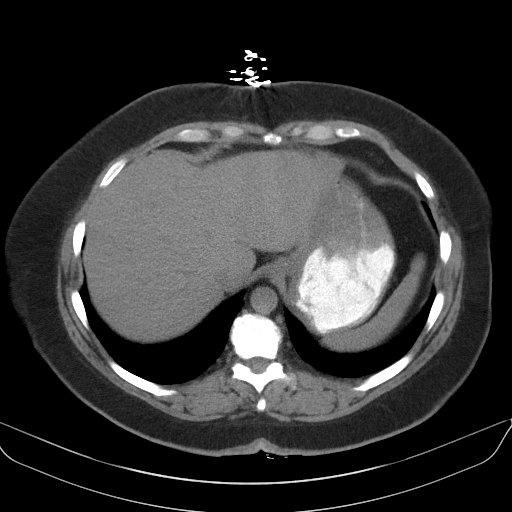
[im 92/96  soft-tissue]
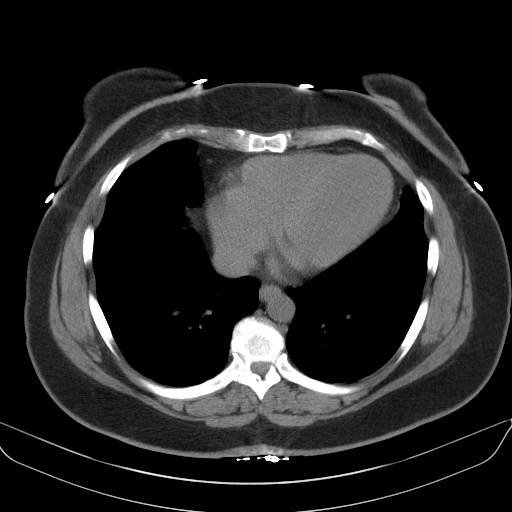

[Series 3: cor · coronal · 0.76mm/px · 3 of 101 slices shown]
[im 34/101  soft-tissue]
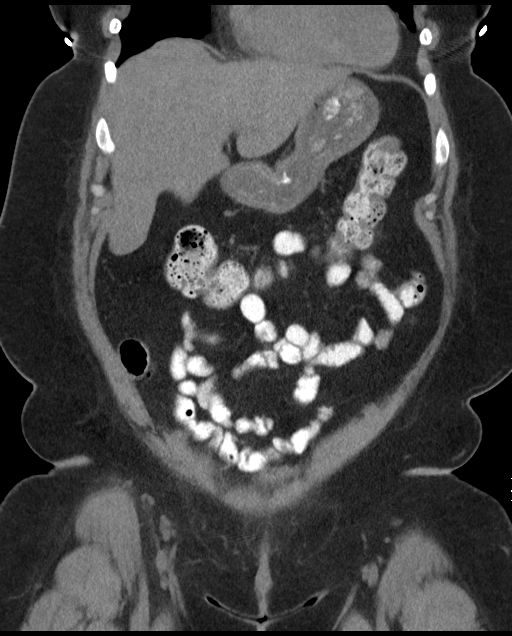
[im 45/101  soft-tissue]
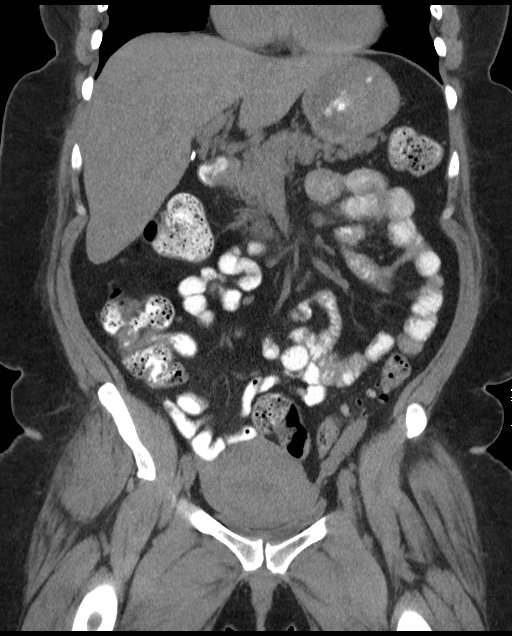
[im 56/101  soft-tissue]
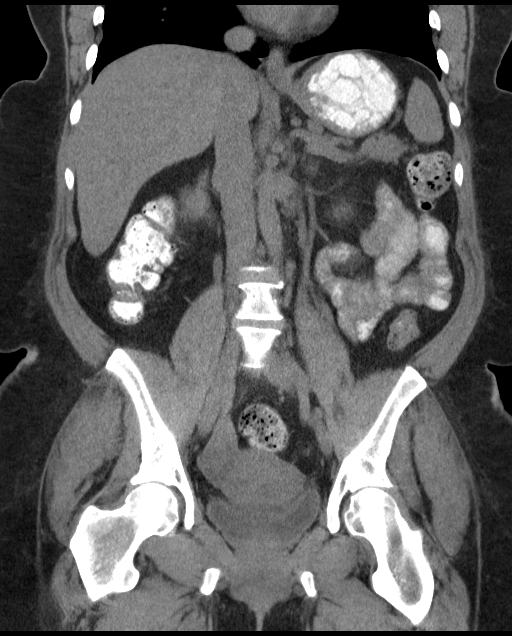

[16 of 46 positions shown; findings below may reference images not displayed]

FINDINGS: The lung bases are clear. The liver is unremarkable in the
unenhanced state. Surgical clips are present from prior
cholecystectomy. The pancreas appears normal in size and the
pancreatic duct is not dilated. The adrenal glands and spleen are
unremarkable. The stomach is moderately distended with oral contrast
and fluid with no abnormality evident. With no renal calculi are
seen and there is no evidence hydronephrosis. The proximal ureters
are normal in caliber. The abdominal aorta is normal caliber.

The distal ureters are normal in caliber and no distal ureteral
calculus is seen. The urinary bladder is not well distended and
therefore difficult to evaluate. The uterus appears more prominent
than on prior CT, and uterine fibroids cannot excluded. In addition
there is low-attenuation centrally and pelvic ultrasound with
transvaginal component may be helpful to evaluate further. Small
right ovarian cyst present. The left ovary is unremarkable. No free
fluid is noted within pelvis. There are scattered rectosigmoid colon
diverticula. The terminal ileum is unremarkable. Also the appendix
appears unremarkable. A small periumbilical hernia containing fat is
present. In addition there is a tiny ventral hernia just above the
umbilicus containing fat as well. The lumbar vertebrae are in normal
alignment with normal intervertebral disc spaces.
IMPRESSION: 1. No explanation for the patient's diffuse abdominal pain is seen.
2. No renal calculi.  No hydronephrosis.
3. Somewhat prominent uterus. Cannot exclude small fibroids, and
there is some prominence of the endometrium. Pelvic ultrasound with
transvaginal component may be helpful to assess further.
4. Small midline ventral hernia just above the umbilicus containing
fat with small periumbilical hernia containing fat as well.

## 2019-01-12 ENCOUNTER — Encounter: Payer: Self-pay | Admitting: Family Medicine

## 2019-01-12 ENCOUNTER — Other Ambulatory Visit: Payer: Self-pay

## 2019-01-12 ENCOUNTER — Ambulatory Visit (INDEPENDENT_AMBULATORY_CARE_PROVIDER_SITE_OTHER): Payer: 59 | Admitting: Family Medicine

## 2019-01-12 VITALS — BP 120/80 | HR 99 | Temp 98.2°F | Wt 248.2 lb

## 2019-01-12 DIAGNOSIS — R81 Glycosuria: Secondary | ICD-10-CM | POA: Diagnosis not present

## 2019-01-12 DIAGNOSIS — R109 Unspecified abdominal pain: Secondary | ICD-10-CM

## 2019-01-12 DIAGNOSIS — R8290 Unspecified abnormal findings in urine: Secondary | ICD-10-CM

## 2019-01-12 DIAGNOSIS — R350 Frequency of micturition: Secondary | ICD-10-CM

## 2019-01-12 DIAGNOSIS — N3000 Acute cystitis without hematuria: Secondary | ICD-10-CM | POA: Diagnosis not present

## 2019-01-12 LAB — POCT URINALYSIS DIP (PROADVANTAGE DEVICE)
Bilirubin, UA: NEGATIVE
Blood, UA: NEGATIVE
Glucose, UA: 500 mg/dL — AB
Ketones, POC UA: NEGATIVE mg/dL
Leukocytes, UA: NEGATIVE
Nitrite, UA: POSITIVE — AB
Protein Ur, POC: NEGATIVE mg/dL
Specific Gravity, Urine: 1.02
Urobilinogen, Ur: NEGATIVE
pH, UA: 6 (ref 5.0–8.0)

## 2019-01-12 MED ORDER — KETOROLAC TROMETHAMINE 60 MG/2ML IM SOLN
60.0000 mg | Freq: Once | INTRAMUSCULAR | Status: AC
  Administered 2019-01-12: 16:00:00 60 mg via INTRAMUSCULAR

## 2019-01-12 MED ORDER — SULFAMETHOXAZOLE-TRIMETHOPRIM 800-160 MG PO TABS: 1.0000 | ORAL_TABLET | Freq: Two times a day (BID) | ORAL | 0 refills | Status: DC

## 2019-01-12 NOTE — Progress Notes (Signed)
Subjective:    Patient ID: Shirley Savage, female    DOB: 1965-04-07, 53 y.o.   MRN: [id number]  HPI Chief Complaint  Patient presents with  . new pt    new pt get established. pain between belly button to right side to mid back. x 4 weeks. has women issues going on which is being treated by obgyn. had abnormal labs recently at obgyn   She is new to the practice and here to establish care.  Previous medical care: [facility]. Last there in 2017 or 2018.   OB/GYN- [facility]  She is scheduled for hysterectomy in November.  She is here with a 2 to 3-year history of intermittent right side pain.  Pain is worse with certain movements.  Pain is relieved with rest.  Occasionally the pain radiates into her right groin. Reports trying multiple pharmacotherapies for her pain including tramadol, ibuprofen, Aleve and Tylenol.  States her OB/GYN nor gastroenterologist think her pain is related to pelvic nor GI etiologies. Colonoscopy done recently per patient and normal.   She also complains of urinary frequency and increased thirst. States 2 weeks ago at OB/GYN she was told she had protein and glucose in her urine.   Denies personal history of diabetes Mother and father with diabetes.   Married. 3 kids.  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   Denies fever, chills, headache, dizziness, chest pain, palpitations, shortness of breath, cough, nausea, vomiting, diarrhea or constipation.  Reviewed allergies, medications, past medical, surgical, family, and social history.   Review of Systems Pertinent positives and negatives in the history of present illness.     Objective:   Physical Exam BP 120/80   Pulse 99   Temp 98.2 F (36.8 C)   Wt 248 lb 3.2 oz (112.6 kg)   BMI 45.40 kg/m   Alert and oriented and in no distress.  Neck is supple without adenopathy or thyromegaly. Cardiac exam shows a regular sinus rhythm without murmurs or gallops. Lungs are clear to  auscultation.  Abdomen is soft, nondistended, normal bowel sounds, diffusely tender to palpation without rebound or guarding.  No palpable masses.  Pain reproduced with movement.  Extremities without edema.  Skin is warm and dry, no pallor or rash.  PERRLA, CNs intact.       Assessment & Plan:  Right sided abdominal pain - Plan: Comprehensive metabolic panel, CBC with Differential/Platelet, Lipase, Amylase -Reports having pain as far back as 2017. discussed that pain with movement speaks to this being a musculoskeletal etiology.  She has apparently been examined by OB/GYN and GI for this pain and nothing was found.  States her gallbladder was removed but the pain did not resolve.  Toradol injection given.  Discussed that the reason she is having a hysterectomy could be causing referred pain as well. Check labs and follow-up  Glucosuria - Plan: Hemoglobin A1c -She does not want to have a fingerstick hemoglobin A1c so I will screen her for diabetes with her labs. Discussed cutting back on sugar and carbohydrates and increasing physical activity.  Discussed that if she does have diabetes which I anticipate, I will prescribe medication for her and bring her back in the office to discuss this in depth.  Abnormal urine odor - Plan: sulfamethoxazole-trimethoprim (BACTRIM DS) 800-160 MG tablet, Urine Culture, POCT Urinalysis DIP (Proadvantage Device)  Acute cystitis without hematuria - Plan: sulfamethoxazole-trimethoprim (BACTRIM DS) 800-160 MG tablet, Urine Culture -Treat with antibiotic and send for urine culture.  Urinary frequency - Plan: POCT Urinalysis DIP (Proadvantage Device) -This may be related to UTI or possibly a new diagnosis of diabetes or both.

## 2019-01-13 ENCOUNTER — Other Ambulatory Visit: Payer: Self-pay | Admitting: Family Medicine

## 2019-01-13 ENCOUNTER — Encounter: Payer: Self-pay | Admitting: Family Medicine

## 2019-01-13 DIAGNOSIS — E119 Type 2 diabetes mellitus without complications: Secondary | ICD-10-CM

## 2019-01-13 LAB — COMPREHENSIVE METABOLIC PANEL
ALT: 20 IU/L (ref 0–32)
AST: 15 IU/L (ref 0–40)
Albumin/Globulin Ratio: 1.7 (ref 1.2–2.2)
Albumin: 4.1 g/dL (ref 3.8–4.9)
Alkaline Phosphatase: 108 IU/L (ref 39–117)
BUN/Creatinine Ratio: 15 (ref 9–23)
BUN: 10 mg/dL (ref 6–24)
Bilirubin Total: 0.5 mg/dL (ref 0.0–1.2)
CO2: 22 mmol/L (ref 20–29)
Calcium: 9.6 mg/dL (ref 8.7–10.2)
Chloride: 106 mmol/L (ref 96–106)
Creatinine, Ser: 0.66 mg/dL (ref 0.57–1.00)
GFR calc Af Amer: 117 mL/min/{1.73_m2} (ref >59)
GFR calc non Af Amer: 102 mL/min/{1.73_m2} (ref >59)
Globulin, Total: 2.4 g/dL (ref 1.5–4.5)
Glucose: 253 mg/dL — ABNORMAL HIGH (ref 65–99)
Potassium: 4 mmol/L (ref 3.5–5.2)
Sodium: 140 mmol/L (ref 134–144)
Total Protein: 6.5 g/dL (ref 6.0–8.5)

## 2019-01-13 LAB — LIPASE: Lipase: 30 U/L (ref 14–72)

## 2019-01-13 LAB — CBC WITH DIFFERENTIAL/PLATELET
Basophils Absolute: 0 10*3/uL (ref 0.0–0.2)
Basos: 0 %
EOS (ABSOLUTE): 0.1 10*3/uL (ref 0.0–0.4)
Eos: 1 %
Hematocrit: 37.1 % (ref 34.0–46.6)
Hemoglobin: 12.6 g/dL (ref 11.1–15.9)
Immature Grans (Abs): 0 10*3/uL (ref 0.0–0.1)
Immature Granulocytes: 0 %
Lymphocytes Absolute: 3 10*3/uL (ref 0.7–3.1)
Lymphs: 40 %
MCH: 30 pg (ref 26.6–33.0)
MCHC: 34 g/dL (ref 31.5–35.7)
MCV: 88 fL (ref 79–97)
Monocytes Absolute: 0.6 10*3/uL (ref 0.1–0.9)
Monocytes: 7 %
Neutrophils Absolute: 3.7 10*3/uL (ref 1.4–7.0)
Neutrophils: 52 %
Platelets: 253 10*3/uL (ref 150–450)
RBC: 4.2 x10E6/uL (ref 3.77–5.28)
RDW: 13.5 % (ref 11.7–15.4)
WBC: 7.4 10*3/uL (ref 3.4–10.8)

## 2019-01-13 LAB — AMYLASE: Amylase: 94 U/L (ref 31–110)

## 2019-01-13 LAB — HEMOGLOBIN A1C
Est. average glucose Bld gHb Est-mCnc: 203 mg/dL
Hgb A1c MFr Bld: 8.7 % — ABNORMAL HIGH (ref 4.8–5.6)

## 2019-01-13 MED ORDER — METFORMIN HCL 500 MG PO TABS: 500.0000 mg | ORAL_TABLET | Freq: Two times a day (BID) | ORAL | 1 refills | Status: DC

## 2019-01-14 LAB — URINE CULTURE

## 2019-01-20 ENCOUNTER — Encounter: Payer: Self-pay | Admitting: Family Medicine

## 2019-01-20 ENCOUNTER — Telehealth: Payer: Self-pay | Admitting: Internal Medicine

## 2019-01-20 ENCOUNTER — Ambulatory Visit (INDEPENDENT_AMBULATORY_CARE_PROVIDER_SITE_OTHER): Payer: 59 | Admitting: Family Medicine

## 2019-01-20 ENCOUNTER — Other Ambulatory Visit: Payer: Self-pay

## 2019-01-20 VITALS — BP 134/86 | HR 87 | Temp 99.1°F | Wt 246.2 lb

## 2019-01-20 DIAGNOSIS — R3989 Other symptoms and signs involving the genitourinary system: Secondary | ICD-10-CM | POA: Diagnosis not present

## 2019-01-20 DIAGNOSIS — R109 Unspecified abdominal pain: Secondary | ICD-10-CM | POA: Diagnosis not present

## 2019-01-20 LAB — POCT URINALYSIS DIP (PROADVANTAGE DEVICE)
Bilirubin, UA: NEGATIVE
Blood, UA: NEGATIVE
Glucose, UA: NEGATIVE mg/dL
Ketones, POC UA: NEGATIVE mg/dL
Leukocytes, UA: NEGATIVE
Nitrite, UA: NEGATIVE
Protein Ur, POC: NEGATIVE mg/dL
Specific Gravity, Urine: 1.02
Urobilinogen, Ur: 0.2
pH, UA: 5.5 (ref 5.0–8.0)

## 2019-01-20 NOTE — Progress Notes (Signed)
   Subjective:    Patient ID: Shirley Savage, female    DOB: [birth year], 53 y.o.   MRN: [id number]  HPI She is here for evaluation of continued difficulty with right flank pain.  She was seen recently and the urinalysis was nitrite positive.  She was placed on an antibiotic however symptoms continue.  She is scheduled for an ultrasound in the near future.  She does state that when she had her last pelvic the pain was reproduced with palpation in the right adnexal area.  She also states that she does have some pain with motion.  No numbness, tingling or weakness.   Review of Systems     Objective:   Physical Exam Alert and in no distress.  Good motion of her back with flexion as well as lateral motion.  No tenderness to palpation in the right flank area.  Urine dipstick was negative.       Assessment & Plan:  Possible urinary tract infection - Plan: POCT Urinalysis DIP (Proadvantage Device)  Right sided abdominal pain I explained that since her pain was reproduced with pelvic exam with pressure in the right adnexal area, that would be to main area for to be evaluated.  She is planning on having a hysterectomy.  I explained that that would hopefully clear this up.  Did recommend 2 Aleve twice per day regularly as well as 2 Tylenol 4 times per day.  She was comfortable with that.

## 2019-01-20 NOTE — Patient Instructions (Signed)
Take 2 Aleve twice per day and you can also take 2 Tylenol 4 times per day.  Keep your appointment with the gynecologist

## 2019-01-20 NOTE — Telephone Encounter (Signed)
Pt is having severe side pain still. And felt extreme pain and has been in bed since Wednesday. She saw some hairline blood in toliet the other day but has not seen something since. She thinks it could be a kidney stone. She has taken extra strength tylenol to help with pain as she had a ketorlac shot last week when seen by Vickie. Pt coming in today to be reevaluated. FYI

## 2019-01-27 ENCOUNTER — Other Ambulatory Visit: Payer: Self-pay

## 2019-01-27 ENCOUNTER — Encounter: Payer: Self-pay | Admitting: Family Medicine

## 2019-01-27 ENCOUNTER — Ambulatory Visit (INDEPENDENT_AMBULATORY_CARE_PROVIDER_SITE_OTHER): Payer: 59 | Admitting: Family Medicine

## 2019-01-27 VITALS — BP 120/70 | HR 74 | Temp 97.6°F | Wt 248.2 lb

## 2019-01-27 DIAGNOSIS — E119 Type 2 diabetes mellitus without complications: Secondary | ICD-10-CM | POA: Insufficient documentation

## 2019-01-27 DIAGNOSIS — Z13220 Encounter for screening for lipoid disorders: Secondary | ICD-10-CM

## 2019-01-27 DIAGNOSIS — Z23 Encounter for immunization: Secondary | ICD-10-CM | POA: Diagnosis not present

## 2019-01-27 DIAGNOSIS — Z87440 Personal history of urinary (tract) infections: Secondary | ICD-10-CM

## 2019-01-27 DIAGNOSIS — Z8349 Family history of other endocrine, nutritional and metabolic diseases: Secondary | ICD-10-CM | POA: Insufficient documentation

## 2019-01-27 LAB — POCT URINALYSIS DIP (PROADVANTAGE DEVICE)
Bilirubin, UA: NEGATIVE
Blood, UA: NEGATIVE
Glucose, UA: NEGATIVE mg/dL
Ketones, POC UA: NEGATIVE mg/dL
Leukocytes, UA: NEGATIVE
Nitrite, UA: NEGATIVE
Protein Ur, POC: NEGATIVE mg/dL
Specific Gravity, Urine: 1.025
Urobilinogen, Ur: NEGATIVE
pH, UA: 5.5

## 2019-01-27 LAB — GLUCOSE, POCT (MANUAL RESULT ENTRY): POC Glucose: 114 mg/dl — AB (ref 70–99)

## 2019-01-27 MED ORDER — ONETOUCH DELICA LANCETS 30G MISC: 1.0000 | Freq: Two times a day (BID) | 2 refills | Status: DC

## 2019-01-27 MED ORDER — ONETOUCH VERIO VI STRP: ORAL_STRIP | 2 refills | Status: DC

## 2019-01-27 NOTE — Progress Notes (Signed)
   Subjective:    Patient ID: Shirley Savage, female    DOB: 1965/05/07, 53 y.o.   MRN: [id number]  HPI Chief Complaint  Patient presents with  . new diabetes    new onset diabetes.    Here to follow up on new diagnosis of diabetes. Hgb A1c 8.7% on 01/12/2019.  This is her second visit with me.  She denies a history of diabetes or even prediabetes. Since her last visit she reports improvement with urinary frequency and with increased thirst. I started her on Metformin 500 mg twice daily.  She has not yet started checking her blood sugars but is willing to do so.  States she has cut back significantly on sugar and carbohydrates.  No longer drinking sugary beverages.  She was treated for UTI at her last visit and the symptoms appear to have resolved.  States colonoscopy is up-to-date with Dr Collene Mares GI.   Dr. Welford Roche is her OB/GYN States she is scheduled to have a hysterectomy soon due to heavy bleeding.  She hopes to be able to stop taking the Megace which she is currently taking for heavy bleeding Mammogram scheduled on 02/15/2019  States Pap smear is up-to-date  She has no new concerns today.  Denies fever, chills, dizziness, chest pain, palpitations, shortness of breath, abdominal pain, N/V/D, urinary symptoms, LE edema.    Reviewed allergies, medications, past medical, surgical, family, and social history.   Review of Systems Pertinent positives and negatives in the history of present illness.     Objective:   Physical Exam BP 120/70   Pulse 74   Temp 97.6 F (36.4 C)   Wt 248 lb 3.2 oz (112.6 kg)   BMI 45.40 kg/m   Alert and oriented and in no acute distress.  Not otherwise examined.      Assessment & Plan:  Diabetes mellitus, new onset ([place]) - Plan: POCT glucose (manual entry), Insulin and C-Peptide, Microalbumin / creatinine urine ratio, POCT Urinalysis DIP (Proadvantage Device), TSH, T4, free, T3 -This is a new diagnosis for her.  In-depth counseling on  standards of care for diabetes and the diabetes spectrum.  She was taught how to check her blood sugars, when to check them and goal readings.  Encouraged her to continue with low sugar, low carbohydrate diet.  Encouraged increased physical activity.  For now, we will continue with Metformin 500 mg twice daily and see how she does after 3 months of this.  Her fasting blood sugar today is 114. No longer symptomatic. She has an upcoming appointment for eye exam and I advised her that this should be a diabetic eye exam and that this would be needed once yearly.  Screening for lipid disorders - Plan: Lipid panel -Discussed the need to put her on a statin due to her diabetes.  She is hesitant but willing to try taking a statin.  Check lipid panel and prescribe appropriate statin therapy.  Discussed potential side effects of statins.  Need for vaccination against Streptococcus pneumoniae - Plan: Pneumococcal polysaccharide vaccine 23-valent greater than or equal to 2yo subcutaneous/IM  Morbid obesity (HCC) - Plan: Lipid panel, TSH, T4, free, T3 -Counseling on healthy diet and exercise for weight loss  History of UTI - Plan: POCT Urinalysis DIP (Proadvantage Device) -Urinalysis dipstick is negative.  Symptoms resolved.  Family history of thyroid disease - Plan: TSH, T4, free, T3

## 2019-01-27 NOTE — Patient Instructions (Addendum)
Continue on metformin 500 mg twice daily with meals.  Continue avoiding sugary beverages. Limit your sweets and carbohydrates such as potatoes, rice, pasta, bread. Try to get at least 150 minutes of physical activity each week.  Check your blood sugars once daily and you can mix up the times you do this.  Fasting blood sugar (nothing to eat or drink for 6 hours except water) should be between 80 and 130 2 hours after a meal your blood sugar goal is 130-160  If you are seeing readings much higher than this, let me know.  Try to drink 6 to 8 glasses of water daily  It is recommended that you get a yearly diabetic eye exam  We will contact you with your cholesterol results.  I will send in a statin for your cholesterol as discussed.  Bring in your blood sugar readings to your follow-up appointment  You may return for a tetanus, diphtheria and pertussis vaccine at your convenience.  Just call and schedule a nurse visit  Recommend you go to the American diabetes Association website and read about standards of care for diabetes

## 2019-01-28 LAB — TSH: TSH: 0.773 u[IU]/mL (ref 0.450–4.500)

## 2019-01-28 LAB — LIPID PANEL
Chol/HDL Ratio: 3.6 ratio (ref 0.0–4.4)
Cholesterol, Total: 160 mg/dL (ref 100–199)
HDL: 44 mg/dL (ref >39)
LDL Chol Calc (NIH): 97 mg/dL (ref 0–99)
Triglycerides: 104 mg/dL (ref 0–149)
VLDL Cholesterol Cal: 19 mg/dL (ref 5–40)

## 2019-01-28 LAB — MICROALBUMIN / CREATININE URINE RATIO
Creatinine, Urine: 100 mg/dL
Microalb/Creat Ratio: 24 mg/g creat (ref 0–29)
Microalbumin, Urine: 23.6 ug/mL

## 2019-01-28 LAB — T3: T3, Total: 113 ng/dL (ref 71–180)

## 2019-01-28 LAB — T4, FREE: Free T4: 1.18 ng/dL (ref 0.82–1.77)

## 2019-01-28 LAB — INSULIN AND C-PEPTIDE, SERUM
C-Peptide: 5.1 ng/mL — ABNORMAL HIGH (ref 1.1–4.4)
INSULIN: 32.3 u[IU]/mL — ABNORMAL HIGH (ref 2.6–24.9)

## 2019-01-30 ENCOUNTER — Other Ambulatory Visit: Payer: Self-pay | Admitting: Family Medicine

## 2019-01-30 MED ORDER — ATORVASTATIN CALCIUM 10 MG PO TABS: 10.0000 mg | ORAL_TABLET | Freq: Every day | ORAL | 1 refills | Status: DC

## 2019-01-31 ENCOUNTER — Encounter: Payer: Self-pay | Admitting: Family Medicine

## 2019-02-08 ENCOUNTER — Ambulatory Visit (HOSPITAL_BASED_OUTPATIENT_CLINIC_OR_DEPARTMENT_OTHER): Admit: 2019-02-08 | Payer: Self-pay | Admitting: Obstetrics and Gynecology

## 2019-02-08 ENCOUNTER — Encounter (HOSPITAL_BASED_OUTPATIENT_CLINIC_OR_DEPARTMENT_OTHER): Payer: Self-pay

## 2019-02-08 SURGERY — HYSTERECTOMY, TOTAL, ROBOT-ASSISTED
Anesthesia: General | Laterality: Bilateral

## 2019-03-17 ENCOUNTER — Other Ambulatory Visit: Payer: Self-pay | Admitting: Family Medicine

## 2019-03-17 DIAGNOSIS — E119 Type 2 diabetes mellitus without complications: Secondary | ICD-10-CM

## 2019-04-17 ENCOUNTER — Ambulatory Visit: Payer: 59 | Admitting: Family Medicine

## 2019-04-18 NOTE — Progress Notes (Deleted)
  Subjective:    Patient ID: Shirley Savage, female    DOB: 01-Dec-1965, 54 y.o.   MRN: [id number]  Shirley Savage is a 54 y.o. female who presents for follow-up of Type 2 diabetes mellitus.  Patient {is/are not:32546} checking home blood sugars.   Home blood sugar records: {dm home sugars:14018} How often is blood sugars being checked: *** Current symptoms include: {dm sx:14075}. Patient denies {dm sx:19199}.  Patient {is/are not:32546} checking their feet daily. Any Foot concerns (callous, ulcer, wound, thickened nails, toenail fungus, skin fungus, hammer toe): *** Last dilated eye exam: ***  Current treatments: {dm interventions:14074}. Medication compliance: {good/fair/poor:33178}  Current diet: {diet habits:16563} Current exercise: {exercise types:16438} Known diabetic complications: {diabetes complications:1215}  The following portions of the patient's history were reviewed and updated as appropriate: allergies, current medications, past medical history, past social history and problem list.  ROS as in subjective above.     Objective:    Physical Exam Alert and in no distress otherwise not examined.  There were no vitals taken for this visit.  Lab Review Diabetic Labs Latest Ref Rng & Units 01/27/2019 01/12/2019 08/19/2010 05/28/2009 05/28/2008  HbA1c 4.8 - 5.6 % - 8.7(H) - - -  Micro/Creat Ratio 0 - 29 mg/g creat 24 - - - -  Chol 100 - 199 mg/dL 160 - - 155 152  HDL >39 mg/dL 44 - - 45 49  Calc LDL 0 - 99 mg/dL 97 - - 98 87  Triglycerides 0 - 149 mg/dL 104 - - 59 81  Creatinine 0.57 - 1.00 mg/dL - 0.66 0.64 0.70 0.69   BP/Weight 01/27/2019 01/20/2019 01/12/2019 [id number]  Systolic BP 123456 [id number] [id number] [id number] -  Diastolic BP 70 86 80 69 -  Wt. (Lbs) 248.2 246.2 248.2 - 220  BMI 45.4 45.03 45.4 - 40.24   No flowsheet data found.  [name]  reports that she has never smoked. She has never used smokeless tobacco. She reports current alcohol use. She reports that she does  not use drugs.     Assessment & Plan:    Diabetes mellitus, new onset ([place])  1. Rx changes: {none:33079} 2. Education: Reviewed 'ABCs' of diabetes management (respective goals in parentheses):  A1C (<7), blood pressure (<130/80), and cholesterol (LDL <100). 3. Compliance at present is estimated to be {good/fair/poor:33178}. Efforts to improve compliance (if necessary) will be directed at {compliance:16716}. 4. Follow up: {NUMBERS; 0-10:33138} {time:11}

## 2019-04-19 ENCOUNTER — Ambulatory Visit: Payer: 59 | Admitting: Family Medicine

## 2019-04-20 ENCOUNTER — Other Ambulatory Visit: Payer: Self-pay | Admitting: Obstetrics and Gynecology

## 2019-06-02 ENCOUNTER — Other Ambulatory Visit: Payer: Self-pay | Admitting: Family Medicine

## 2019-06-02 DIAGNOSIS — E119 Type 2 diabetes mellitus without complications: Secondary | ICD-10-CM

## 2019-06-15 ENCOUNTER — Encounter (HOSPITAL_BASED_OUTPATIENT_CLINIC_OR_DEPARTMENT_OTHER): Payer: Self-pay

## 2019-06-15 ENCOUNTER — Ambulatory Visit (HOSPITAL_BASED_OUTPATIENT_CLINIC_OR_DEPARTMENT_OTHER): Admit: 2019-06-15 | Payer: Self-pay | Admitting: Obstetrics and Gynecology

## 2019-06-15 SURGERY — XI ROBOTIC ASSISTED LAPAROSCOPIC HYSTERECTOMY AND SALPINGECTOMY
Anesthesia: General | Laterality: Bilateral

## 2019-08-21 ENCOUNTER — Other Ambulatory Visit: Payer: Self-pay | Admitting: Obstetrics and Gynecology

## 2019-08-23 ENCOUNTER — Other Ambulatory Visit: Payer: Self-pay

## 2019-08-23 NOTE — Patient Instructions (Addendum)
YOU ARE SCHEDULED FOR A COVID TEST ___6-1-21______@___9 :30 am _________. THIS TEST MUST BE DONE BEFORE SURGERY. GO TO  [address], [place], [address] AND REMAIN IN YOUR CAR, THIS IS A DRIVE UP TEST. ONCE YOUR COVID TEST IS DONE PLEASE FOLLOW ALL THE QUARANTINE  INSTRUCTIONS GIVEN IN YOUR HANDOUT.      Your procedure is scheduled on 09-07-19   Report to [facility]. M.   Call this number if you have problems the morning of surgery  :[phone].   OUR ADDRESS IS [address].  WE ARE LOCATED IN [facility].                                     REMEMBER:   DO NOT EAT FOOD OR DRINK LIQUIDS AFTER MIDNIGHT .    YOU MAY  BRUSH YOUR TEETH MORNING OF SURGERY AND RINSE YOUR MOUTH OUT, NO CHEWING GUM CANDY OR MINTS.   TAKE THESE MEDICATIONS MORNING OF SURGERY WITH A SIP OF WATER:  ____megace, liptor__________NO DIABETIC MEDICINES__day of surgery__________________  IF YOU ARE SPENDING THE NIGHT AFTER SURGERY PLEASE BRING ALL YOUR PRESCRIPTION MEDICATIONS IN THEIR ORIGINAL BOTTLES.    one  VISITOR IS ALLOWED IN WAITING ROOM ONLY DAY OF SURGERY. NO VISITOR MAY SPEND THE NIGHT. VISITOR ARE ALLOWED TO STAY UNTIL 800 PM.                                    DO NOT WEAR JEWERLY, MAKE UP, OR NAIL POLISH ON FINGERNAILS. DO NOT WEAR LOTIONS, POWDERS, PERFUMES OR DEODORANT. DO NOT SHAVE FOR 24 HOURS PRIOR TO DAY OF SURGERY. MEN MAY SHAVE FACE AND NECK. CONTACTS, GLASSES, OR DENTURES MAY NOT BE WORN TO SURGERY.                                    [facility] IS NOT RESPONSIBLE  FOR ANY BELONGINGS.                                   [facility] - Preparing for Surgery Before surgery, you can play an important role.  Because skin is not sterile, your skin needs to be as free of germs as possible.  You can reduce the number of germs on your skin by washing with CHG (chlorahexidine gluconate) soap before surgery.  CHG is an antiseptic cleaner which  kills germs and bonds with the skin to continue killing germs even after washing. Please DO NOT use if you have an allergy to CHG or antibacterial soaps.  If your skin becomes reddened/irritated stop using the CHG and inform your nurse when you arrive at Short Stay. Do not shave (including legs and underarms) for at least 48 hours prior to the first CHG shower.  You may shave your face/neck. Please follow these instructions carefully:  1.  Shower with CHG Soap the night before surgery and the  morning of Surgery.  2.  If you choose to wash your hair, wash your hair first as usual with your  normal  shampoo.  3.  After you shampoo, rinse your hair  and body thoroughly to remove the  shampoo.                           4.  Use CHG as you would any other liquid soap.  You can apply chg directly  to the skin and wash                       Gently with a scrungie or clean washcloth.  5.  Apply the CHG Soap to your body ONLY FROM THE NECK DOWN.   Do not use on face/ open                           Wound or open sores. Avoid contact with eyes, ears mouth and genitals (private parts).                       Wash face,  Genitals (private parts) with your normal soap.             6.  Wash thoroughly, paying special attention to the area where your surgery  will be performed.  7.  Thoroughly rinse your body with warm water from the neck down.  8.  DO NOT shower/wash with your normal soap after using and rinsing off  the CHG Soap.                9.  Pat yourself dry with a clean towel.            10.  Wear clean pajamas.            11.  Place clean sheets on your bed the night of your first shower and do not  sleep with pets. Day of Surgery : Do not apply any lotions/deodorants the morning of surgery.  Please wear clean clothes to the hospital/surgery center.  FAILURE TO FOLLOW THESE INSTRUCTIONS MAY RESULT IN THE CANCELLATION OF YOUR SURGERY PATIENT SIGNATURE_________________________________  NURSE  SIGNATURE__________________________________  ________________________________________________________________________

## 2019-08-23 NOTE — Progress Notes (Addendum)
PCP - Harland Dingwall, NP-C Cardiologist -   Chest x-ray -  EKG - 08-29-19 epic Stress Test -  ECHO -  Cardiac Cath -   Sleep Study -  CPAP -   Fasting Blood Sugar -  Checks Blood Sugar _____ times a day  Blood Thinner Instructions: Aspirin Instructions: Last Dose:  Anesthesia review: ua clear  ,   Patient denies shortness of breath, fever, cough and chest pain at PAT appointment  None pt. Had previous ecoli in urine per PA for Dr. Cletis Media treated with macrobid , pt c/o of right flank pain at preop and stated" I dont feel like my UTI is cleared up." UA repeated and negative at preop   Patient verbalized understanding of instructions that were given to them at the PAT appointment. Patient was also instructed that they will need to review over the PAT instructions again at home before surgery.

## 2019-08-24 ENCOUNTER — Other Ambulatory Visit: Payer: Self-pay

## 2019-08-24 ENCOUNTER — Encounter (HOSPITAL_COMMUNITY)
Admission: RE | Admit: 2019-08-24 | Discharge: 2019-08-24 | Disposition: A | Discharge status: Home / Self Care | Payer: 59 | Source: Ambulatory Visit | Attending: Obstetrics and Gynecology | Admitting: Obstetrics and Gynecology

## 2019-08-24 ENCOUNTER — Other Ambulatory Visit: Payer: Self-pay | Admitting: Family Medicine

## 2019-08-24 ENCOUNTER — Encounter (HOSPITAL_COMMUNITY): Payer: Self-pay

## 2019-08-24 DIAGNOSIS — E119 Type 2 diabetes mellitus without complications: Secondary | ICD-10-CM

## 2019-08-24 DIAGNOSIS — Z01818 Encounter for other preprocedural examination: Secondary | ICD-10-CM | POA: Insufficient documentation

## 2019-08-24 HISTORY — DX: Type 2 diabetes mellitus without complications: E11.9

## 2019-08-24 HISTORY — DX: Unspecified osteoarthritis, unspecified site: M19.90

## 2019-08-24 NOTE — Telephone Encounter (Signed)
Left message for pt to call back to schedule an appt as she is overdue. Once scheduled we can refill until her appt

## 2019-08-28 NOTE — H&P (Signed)
Shirley Savage is a 54 y.o.  female, P: 3-0-0-3, presents for hysterectomy because of menorrhagia and adenomyosis. Patient gives a 15 year history of heavy bleeding that at time would cause blood to run down her legs. Attempts at management have included Depo Provera, NuvaRing, hysteroscopic polypectomy,  and Mirena IUD (which was spontaneously expelled). Over the past year and a half the patient has had almost daily bleeding,  for which she was given norethindrone but her bleeding did not abate. Due to her history of adenomyosis she was given a trial of  Barbette Merino but her bleeding persisted.. Finally she was placed on Megace 40 mg daily,  which after a month, caused her bleeding to stop. Attempts at decreasing the Megace  dosage resulted in a recurrence of bleeding. The patient does not accept blood products due to her religion but fortunately  her hemoglobin has been between 11.2 - 12.7. She admits to urinary frequency and deep dyspareunia but denies any changes in bowel function or vaginitis symptoms. A pelvic ultrasound November 2020 anteverted uterus: diffuse adenomyosis (9.1 cm from fundus to external os) 6.34 x 5.09 x 7.33 cm ( 123.5 mL) , endometrium: 8.92 mm; right ovary-2.97 cm and left ovary- 3.16 cm. An endometrial biopsy at her pre-op exam returned benign polypoid endometrium with progestin effect. After reviewing all of the medical and surgical options available the patient has decided to proceed with definitive therapy in the form of hysterectomy.  Past Medical History  OB History: G: 3; P: 3-0-0-3:  C-sections: 1985, 1989 and 2002  GYN History: menarche: 54YO;  LMP: see HPI;    Contraception: Tubal Sterilization;  Denies history of abnormal PAP smear.;  Last PAP smear: 2020-normal with negative HPV  Medical History: Anemia, Diabetes Mellitus and  Right Knee Osteoarthritis  Surgical History: 2002 Tubal Sterilization; 2012 Cholecystectomy; 2013 Hysteroscopy D & C ? 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 Other     NO BLOOD PRODUCTS  REFUSAL     Denies sensitivity to peanuts, shellfish, soy, latex or adhesives.   ROS: Admits to glasses, occasional right knee swelling and sporadic nausea episodes; DECLINES ALL BLOOD PRODUCTS;  Denies headache, vision changes, nasal congestion, dysphagia, tinnitus, dizziness, hoarseness, cough,  chest pain, shortness of breath,  vomiting, diarrhea,constipation,  urinary frequency, urgency  dysuria, hematuria, vaginitis symptoms, pelvic pain, easy bruising,  myalgias, arthralgias, skin rashes, unexplained weight loss and except as is mentioned in the history of present illness, patient's review of systems is otherwise negative.     Physical Exam  Bp: 124/64;  P: 91 bpm; R: 17;  Temperature: 98.6 degrees F temporally; Weight: 238 lbs.;  Height: 5\' 3"   BMI: 42.2  Neck: supple without masses or thyromegaly Lungs: clear to auscultation Heart: regular rate and rhythm Abdomen: soft, non-tender and no organomegaly Pelvic:EGBUS- wnl; vagina-normal rugae; uterus-normal size, cervix without lesions or motion tenderness; adnexae-no tenderness or masses Extremities:  no clubbing, cyanosis or edema   Assesment: Menorrhagia  Adenomyosis   Disposition: The patient was given the indication for her procedure(s) along with the risks, which include but are not limited to, reaction to anesthesia, damage to adjacent organs,  infection, excessive bleeding, formation of scar tissue, early  menopause, pelvic prolapse and the possible need for an open abdominal incision. She was further advised that she will experience transient post operative facial edema, that her hospital stay is expected to be 0-2 days, she should be able to return to her usual activities within 2-3 weeks (except intercourse to be delayed until 6 weeks) and that the robotic approach to her surgery requires more time to perform than an open abdominal approach.  Patient was given the Miralax bowel prep to be completed the day before her  procedure.  She verbalized understanding of these risks and pre-operative instructions and has consented to proceed with a Robot Assisted Total Laparoscopic Hysterectomy with Bilateral Salpingectomy at [facility] on September 07, 2019 at noon.  CSN# [id number]   Cyanna Neace J. Florene Glen, PA-C  for Dr. Dede Query. Rivard

## 2019-08-29 ENCOUNTER — Other Ambulatory Visit: Payer: Self-pay

## 2019-08-29 ENCOUNTER — Encounter (HOSPITAL_COMMUNITY)
Admission: RE | Admit: 2019-08-29 | Discharge: 2019-08-29 | Disposition: A | Discharge status: Home / Self Care | Payer: 59 | Source: Ambulatory Visit | Attending: Obstetrics and Gynecology | Admitting: Obstetrics and Gynecology

## 2019-08-29 ENCOUNTER — Other Ambulatory Visit: Payer: Self-pay | Admitting: Obstetrics and Gynecology

## 2019-08-29 DIAGNOSIS — R9431 Abnormal electrocardiogram [ECG] [EKG]: Secondary | ICD-10-CM | POA: Diagnosis not present

## 2019-08-29 DIAGNOSIS — Z01818 Encounter for other preprocedural examination: Secondary | ICD-10-CM | POA: Diagnosis not present

## 2019-08-29 LAB — BASIC METABOLIC PANEL
Anion gap: 8 (ref 5–15)
BUN: 11 mg/dL (ref 6–20)
CO2: 24 mmol/L (ref 22–32)
Calcium: 9.5 mg/dL (ref 8.9–10.3)
Chloride: 107 mmol/L (ref 98–111)
Creatinine, Ser: 0.6 mg/dL (ref 0.44–1.00)
GFR calc Af Amer: >60 mL/min (ref >60)
GFR calc non Af Amer: >60 mL/min (ref >60)
Glucose, Bld: 116 mg/dL — ABNORMAL HIGH (ref 70–99)
Potassium: 4.4 mmol/L (ref 3.5–5.1)
Sodium: 139 mmol/L (ref 135–145)

## 2019-08-29 LAB — URINALYSIS, ROUTINE W REFLEX MICROSCOPIC
Bilirubin Urine: NEGATIVE
Glucose, UA: NEGATIVE mg/dL
Hgb urine dipstick: NEGATIVE
Ketones, ur: NEGATIVE mg/dL
Leukocytes,Ua: NEGATIVE
Nitrite: NEGATIVE
Protein, ur: NEGATIVE mg/dL
Specific Gravity, Urine: 1.011 (ref 1.005–1.030)
pH: 5 (ref 5.0–8.0)

## 2019-08-29 LAB — HEMOGLOBIN A1C
Hgb A1c MFr Bld: 6.6 % — ABNORMAL HIGH (ref 4.8–5.6)
Mean Plasma Glucose: 142.72 mg/dL

## 2019-08-29 LAB — CBC
HCT: 37 % (ref 36.0–46.0)
Hemoglobin: 12.3 g/dL (ref 12.0–15.0)
MCH: 29.4 pg (ref 26.0–34.0)
MCHC: 33.2 g/dL (ref 30.0–36.0)
MCV: 88.5 fL (ref 80.0–100.0)
Platelets: 236 10*3/uL (ref 150–400)
RBC: 4.18 MIL/uL (ref 3.87–5.11)
RDW: 18.1 % — ABNORMAL HIGH (ref 11.5–15.5)
WBC: 6.2 10*3/uL (ref 4.0–10.5)
nRBC: 0 % (ref 0.0–0.2)

## 2019-08-29 LAB — NO BLOOD PRODUCTS

## 2019-08-29 LAB — GLUCOSE, CAPILLARY: Glucose-Capillary: 115 mg/dL — ABNORMAL HIGH (ref 70–99)

## 2019-08-29 NOTE — Progress Notes (Signed)
Spoke with [employer] for Dr. Cletis Media she stated Earnstine Regal PA stated pt. ua showed Ecoli and pt. Was treated with macrobid. Pt. ua negative at preop.

## 2019-08-29 NOTE — Progress Notes (Signed)
Spoke with Rada Hay for Doctor Rivard pt. Complained of right side pain after treatment for UA per Konrad Felix we obtained UA at preop . Oley Balm stated she would inform the PA  [employer] .

## 2019-09-05 ENCOUNTER — Telehealth: Payer: Self-pay | Admitting: Family Medicine

## 2019-09-05 ENCOUNTER — Other Ambulatory Visit (HOSPITAL_COMMUNITY)
Admission: RE | Admit: 2019-09-05 | Discharge: 2019-09-05 | Disposition: A | Discharge status: Home / Self Care | Payer: 59 | Source: Ambulatory Visit | Attending: Obstetrics and Gynecology | Admitting: Obstetrics and Gynecology

## 2019-09-05 DIAGNOSIS — Z20822 Contact with and (suspected) exposure to covid-19: Secondary | ICD-10-CM | POA: Insufficient documentation

## 2019-09-05 DIAGNOSIS — Z01812 Encounter for preprocedural laboratory examination: Secondary | ICD-10-CM | POA: Diagnosis present

## 2019-09-05 LAB — SARS CORONAVIRUS 2 (TAT 6-24 HRS): SARS Coronavirus 2: NEGATIVE

## 2019-09-05 NOTE — Telephone Encounter (Signed)
Dismissal letter in guarantor snapshot  °

## 2019-09-06 NOTE — Anesthesia Preprocedure Evaluation (Addendum)
Anesthesia Evaluation  Patient identified by MRN, date of birth, ID band Patient awake    Reviewed: Allergy & Precautions, NPO status , Patient's Chart, lab work & pertinent test results  History of Anesthesia Complications Negative for: history of anesthetic complications  Airway Mallampati: II  TM Distance: >3 FB Neck ROM: Full    Dental  (+) Missing,    Pulmonary neg pulmonary ROS,    Pulmonary exam normal        Cardiovascular negative cardio ROS Normal cardiovascular exam     Neuro/Psych negative neurological ROS  negative psych ROS   GI/Hepatic negative GI ROS, Neg liver ROS,   Endo/Other  diabetes, Type 2, Oral Hypoglycemic AgentsMorbid obesity  Renal/GU negative Renal ROS  negative genitourinary   Musculoskeletal  (+) Arthritis ,   Abdominal   Peds  Hematology  (+) REFUSES BLOOD PRODUCTS, JEHOVAH'S WITNESSHgb 12.3   Anesthesia Other Findings Day of surgery medications reviewed with patient.  Reproductive/Obstetrics negative OB ROS                            Anesthesia Physical Anesthesia Plan  ASA: III  Anesthesia Plan: General   Post-op Pain Management:    Induction: Intravenous  PONV Risk Score and Plan: 4 or greater and Scopolamine patch - Pre-op, Midazolam, Ondansetron, Dexamethasone and Treatment may vary due to age or medical condition  Airway Management Planned: Oral ETT  Additional Equipment: None  Intra-op Plan:   Post-operative Plan: Extubation in OR  Informed Consent: I have reviewed the patients History and Physical, chart, labs and discussed the procedure including the risks, benefits and alternatives for the proposed anesthesia with the patient or authorized representative who has indicated his/her understanding and acceptance.     Dental advisory given  Plan Discussed with: CRNA  Anesthesia Plan Comments: (Patient is [place] witness, refuses  blood products under all circumstances including permanent disability or death. Will accept albumin. Daiva Huge, MD)      Anesthesia Quick Evaluation

## 2019-09-07 ENCOUNTER — Other Ambulatory Visit: Payer: Self-pay

## 2019-09-07 ENCOUNTER — Ambulatory Visit (HOSPITAL_BASED_OUTPATIENT_CLINIC_OR_DEPARTMENT_OTHER): Payer: 59 | Admitting: Anesthesiology

## 2019-09-07 ENCOUNTER — Ambulatory Visit (HOSPITAL_BASED_OUTPATIENT_CLINIC_OR_DEPARTMENT_OTHER): Payer: 59 | Admitting: Physician Assistant

## 2019-09-07 ENCOUNTER — Ambulatory Visit (HOSPITAL_BASED_OUTPATIENT_CLINIC_OR_DEPARTMENT_OTHER)
Admission: RE | Admit: 2019-09-07 | Discharge: 2019-09-07 | Disposition: A | Discharge status: Home / Self Care | Payer: 59 | Attending: Obstetrics and Gynecology | Admitting: Obstetrics and Gynecology

## 2019-09-07 ENCOUNTER — Encounter (HOSPITAL_BASED_OUTPATIENT_CLINIC_OR_DEPARTMENT_OTHER): Payer: Self-pay | Admitting: Obstetrics and Gynecology

## 2019-09-07 ENCOUNTER — Encounter (HOSPITAL_BASED_OUTPATIENT_CLINIC_OR_DEPARTMENT_OTHER)
Admission: RE | Disposition: A | Discharge status: Home / Self Care | Payer: Self-pay | Source: Home / Self Care | Attending: Obstetrics and Gynecology

## 2019-09-07 DIAGNOSIS — M1990 Unspecified osteoarthritis, unspecified site: Secondary | ICD-10-CM | POA: Diagnosis not present

## 2019-09-07 DIAGNOSIS — Z6841 Body Mass Index (BMI) 40.0 and over, adult: Secondary | ICD-10-CM | POA: Insufficient documentation

## 2019-09-07 DIAGNOSIS — Z7984 Long term (current) use of oral hypoglycemic drugs: Secondary | ICD-10-CM | POA: Diagnosis not present

## 2019-09-07 DIAGNOSIS — N8003 Adenomyosis of the uterus: Secondary | ICD-10-CM

## 2019-09-07 DIAGNOSIS — N920 Excessive and frequent menstruation with regular cycle: Secondary | ICD-10-CM | POA: Diagnosis present

## 2019-09-07 DIAGNOSIS — N838 Other noninflammatory disorders of ovary, fallopian tube and broad ligament: Secondary | ICD-10-CM | POA: Insufficient documentation

## 2019-09-07 DIAGNOSIS — Z79899 Other long term (current) drug therapy: Secondary | ICD-10-CM | POA: Diagnosis not present

## 2019-09-07 DIAGNOSIS — N800 Endometriosis of uterus: Secondary | ICD-10-CM | POA: Diagnosis not present

## 2019-09-07 DIAGNOSIS — N921 Excessive and frequent menstruation with irregular cycle: Secondary | ICD-10-CM

## 2019-09-07 DIAGNOSIS — N938 Other specified abnormal uterine and vaginal bleeding: Secondary | ICD-10-CM | POA: Diagnosis not present

## 2019-09-07 DIAGNOSIS — E119 Type 2 diabetes mellitus without complications: Secondary | ICD-10-CM | POA: Diagnosis not present

## 2019-09-07 HISTORY — PX: ROBOTIC ASSISTED LAPAROSCOPIC HYSTERECTOMY AND SALPINGECTOMY: SHX6379

## 2019-09-07 LAB — GLUCOSE, CAPILLARY
Glucose-Capillary: 125 mg/dL — ABNORMAL HIGH (ref 70–99)
Glucose-Capillary: 115 mg/dL — ABNORMAL HIGH (ref 70–99)
Glucose-Capillary: 156 mg/dL — ABNORMAL HIGH (ref 70–99)

## 2019-09-07 LAB — NO BLOOD PRODUCTS

## 2019-09-07 SURGERY — XI ROBOTIC ASSISTED LAPAROSCOPIC HYSTERECTOMY AND SALPINGECTOMY
Anesthesia: General | Site: Abdomen | Laterality: Bilateral

## 2019-09-07 MED ORDER — DEXAMETHASONE SODIUM PHOSPHATE 10 MG/ML IJ SOLN
4.0000 mg | INTRAMUSCULAR | Status: AC
  Administered 2019-09-07: 10 mg via INTRAVENOUS

## 2019-09-07 MED ORDER — OXYCODONE-ACETAMINOPHEN 5-325 MG PO TABS
ORAL_TABLET | ORAL | Status: AC
  Filled 2019-09-07: qty 1

## 2019-09-07 MED ORDER — SUGAMMADEX SODIUM 500 MG/5ML IV SOLN
INTRAVENOUS | Status: AC
  Filled 2019-09-07: qty 5

## 2019-09-07 MED ORDER — ONDANSETRON HCL 4 MG/2ML IJ SOLN
INTRAMUSCULAR | Status: DC | PRN
  Administered 2019-09-07 (×2): 4 mg via INTRAVENOUS

## 2019-09-07 MED ORDER — DEXAMETHASONE SODIUM PHOSPHATE 10 MG/ML IJ SOLN
INTRAMUSCULAR | Status: AC
  Filled 2019-09-07: qty 1

## 2019-09-07 MED ORDER — PROMETHAZINE HCL 25 MG/ML IJ SOLN: 6.2500 mg | INTRAMUSCULAR | Status: DC | PRN

## 2019-09-07 MED ORDER — MIDAZOLAM HCL 5 MG/5ML IJ SOLN
INTRAMUSCULAR | Status: DC | PRN
  Administered 2019-09-07: 2 mg via INTRAVENOUS

## 2019-09-07 MED ORDER — LIDOCAINE 2% (20 MG/ML) 5 ML SYRINGE
INTRAMUSCULAR | Status: AC
  Filled 2019-09-07: qty 5

## 2019-09-07 MED ORDER — MENTHOL 3 MG MT LOZG: 1.0000 | LOZENGE | OROMUCOSAL | Status: DC | PRN

## 2019-09-07 MED ORDER — OXYCODONE HCL 5 MG PO TABS: 5.0000 mg | ORAL_TABLET | Freq: Once | ORAL | Status: DC | PRN

## 2019-09-07 MED ORDER — GABAPENTIN 300 MG PO CAPS
ORAL_CAPSULE | ORAL | Status: AC
  Filled 2019-09-07: qty 1

## 2019-09-07 MED ORDER — SCOPOLAMINE 1 MG/3DAYS TD PT72
MEDICATED_PATCH | TRANSDERMAL | Status: AC
  Filled 2019-09-07: qty 1

## 2019-09-07 MED ORDER — DOCUSATE SODIUM 100 MG PO CAPS
100.0000 mg | ORAL_CAPSULE | Freq: Two times a day (BID) | ORAL | Status: DC
  Administered 2019-09-07: 100 mg via ORAL

## 2019-09-07 MED ORDER — ACETAMINOPHEN 500 MG PO TABS
ORAL_TABLET | ORAL | Status: AC
  Filled 2019-09-07: qty 2

## 2019-09-07 MED ORDER — HYDRALAZINE HCL 20 MG/ML IJ SOLN
INTRAMUSCULAR | Status: DC | PRN
  Administered 2019-09-07: 5 mg via INTRAVENOUS
  Administered 2019-09-07: 2.5 mg via INTRAVENOUS

## 2019-09-07 MED ORDER — PANTOPRAZOLE SODIUM 40 MG PO TBEC
40.0000 mg | DELAYED_RELEASE_TABLET | Freq: Every day | ORAL | Status: DC
  Administered 2019-09-07: 40 mg via ORAL

## 2019-09-07 MED ORDER — FENTANYL CITRATE (PF) 250 MCG/5ML IJ SOLN
INTRAMUSCULAR | Status: AC
  Filled 2019-09-07: qty 5

## 2019-09-07 MED ORDER — DOCUSATE SODIUM 100 MG PO CAPS
ORAL_CAPSULE | ORAL | Status: AC
  Filled 2019-09-07: qty 1

## 2019-09-07 MED ORDER — PROPOFOL 10 MG/ML IV BOLUS
INTRAVENOUS | Status: AC
  Filled 2019-09-07: qty 40

## 2019-09-07 MED ORDER — IBUPROFEN 200 MG PO TABS: 600.0000 mg | ORAL_TABLET | Freq: Four times a day (QID) | ORAL | Status: DC

## 2019-09-07 MED ORDER — ACETAMINOPHEN 500 MG PO TABS: 1000.0000 mg | ORAL_TABLET | ORAL | Status: DC

## 2019-09-07 MED ORDER — CELECOXIB 200 MG PO CAPS
400.0000 mg | ORAL_CAPSULE | ORAL | Status: AC
  Administered 2019-09-07: 400 mg via ORAL

## 2019-09-07 MED ORDER — FENTANYL CITRATE (PF) 100 MCG/2ML IJ SOLN: 25.0000 ug | INTRAMUSCULAR | Status: DC | PRN

## 2019-09-07 MED ORDER — ACETAMINOPHEN 500 MG PO TABS: 1000.0000 mg | ORAL_TABLET | Freq: Four times a day (QID) | ORAL | Status: DC

## 2019-09-07 MED ORDER — SODIUM CHLORIDE 0.9 % IV SOLN
INTRAVENOUS | Status: DC | PRN
  Administered 2019-09-07: 60 mL

## 2019-09-07 MED ORDER — LACTATED RINGERS IV SOLN: INTRAVENOUS | Status: DC

## 2019-09-07 MED ORDER — PROPOFOL 10 MG/ML IV BOLUS
INTRAVENOUS | Status: DC | PRN
  Administered 2019-09-07: 180 mg via INTRAVENOUS
  Administered 2019-09-07: 20 mg via INTRAVENOUS

## 2019-09-07 MED ORDER — KETOROLAC TROMETHAMINE 30 MG/ML IJ SOLN
INTRAMUSCULAR | Status: AC
  Filled 2019-09-07: qty 1

## 2019-09-07 MED ORDER — OXYCODONE HCL 5 MG/5ML PO SOLN: 5.0000 mg | Freq: Once | ORAL | Status: DC | PRN

## 2019-09-07 MED ORDER — KETOROLAC TROMETHAMINE 30 MG/ML IJ SOLN
30.0000 mg | Freq: Once | INTRAMUSCULAR | Status: AC
  Administered 2019-09-07: 30 mg via INTRAVENOUS

## 2019-09-07 MED ORDER — PANTOPRAZOLE SODIUM 40 MG PO TBEC
DELAYED_RELEASE_TABLET | ORAL | Status: AC
  Filled 2019-09-07: qty 1

## 2019-09-07 MED ORDER — SCOPOLAMINE 1 MG/3DAYS TD PT72
1.0000 | MEDICATED_PATCH | Freq: Once | TRANSDERMAL | Status: DC
  Administered 2019-09-07: 1.5 mg via TRANSDERMAL

## 2019-09-07 MED ORDER — ROCURONIUM BROMIDE 10 MG/ML (PF) SYRINGE
PREFILLED_SYRINGE | INTRAVENOUS | Status: AC
  Filled 2019-09-07: qty 10

## 2019-09-07 MED ORDER — ACETAMINOPHEN 500 MG PO TABS: ORAL_TABLET | ORAL | 1 refills | Status: DC

## 2019-09-07 MED ORDER — SUGAMMADEX SODIUM 200 MG/2ML IV SOLN
INTRAVENOUS | Status: DC | PRN
  Administered 2019-09-07: 230 mg via INTRAVENOUS

## 2019-09-07 MED ORDER — SCOPOLAMINE 1 MG/3DAYS TD PT72: 1.0000 | MEDICATED_PATCH | TRANSDERMAL | Status: DC

## 2019-09-07 MED ORDER — IBUPROFEN 600 MG PO TABS
ORAL_TABLET | ORAL | 0 refills | Status: DC
Start: 2019-09-07 — End: 2021-06-27

## 2019-09-07 MED ORDER — ACETAMINOPHEN 500 MG PO TABS
1000.0000 mg | ORAL_TABLET | Freq: Once | ORAL | Status: AC
  Administered 2019-09-07: 1000 mg via ORAL

## 2019-09-07 MED ORDER — ONDANSETRON HCL 4 MG/2ML IJ SOLN: 4.0000 mg | Freq: Four times a day (QID) | INTRAMUSCULAR | Status: DC | PRN

## 2019-09-07 MED ORDER — CELECOXIB 200 MG PO CAPS
ORAL_CAPSULE | ORAL | Status: AC
  Filled 2019-09-07: qty 2

## 2019-09-07 MED ORDER — SODIUM CHLORIDE 0.9 % IV SOLN
2.0000 g | INTRAVENOUS | Status: AC
  Administered 2019-09-07: 2 g via INTRAVENOUS

## 2019-09-07 MED ORDER — MIDAZOLAM HCL 2 MG/2ML IJ SOLN
INTRAMUSCULAR | Status: AC
  Filled 2019-09-07: qty 2

## 2019-09-07 MED ORDER — OXYCODONE-ACETAMINOPHEN 5-325 MG PO TABS
1.0000 | ORAL_TABLET | ORAL | Status: DC | PRN
  Administered 2019-09-07 (×2): 1 via ORAL

## 2019-09-07 MED ORDER — ONDANSETRON HCL 4 MG/2ML IJ SOLN
INTRAMUSCULAR | Status: AC
  Filled 2019-09-07: qty 2

## 2019-09-07 MED ORDER — GABAPENTIN 300 MG PO CAPS
300.0000 mg | ORAL_CAPSULE | ORAL | Status: AC
  Administered 2019-09-07: 300 mg via ORAL

## 2019-09-07 MED ORDER — HYDRALAZINE HCL 20 MG/ML IJ SOLN
INTRAMUSCULAR | Status: AC
  Filled 2019-09-07: qty 1

## 2019-09-07 MED ORDER — LABETALOL HCL 5 MG/ML IV SOLN
INTRAVENOUS | Status: DC | PRN
  Administered 2019-09-07 (×4): 5 mg via INTRAVENOUS

## 2019-09-07 MED ORDER — ONDANSETRON HCL 4 MG PO TABS: 4.0000 mg | ORAL_TABLET | Freq: Four times a day (QID) | ORAL | Status: DC | PRN

## 2019-09-07 MED ORDER — SODIUM CHLORIDE 0.9 % IV SOLN
INTRAVENOUS | Status: AC
  Filled 2019-09-07: qty 2

## 2019-09-07 MED ORDER — LIDOCAINE 2% (20 MG/ML) 5 ML SYRINGE
INTRAMUSCULAR | Status: DC | PRN
  Administered 2019-09-07: 100 mg via INTRAVENOUS
  Administered 2019-09-07: .707 mg/kg/h via INTRAVENOUS

## 2019-09-07 MED ORDER — OXYCODONE HCL 5 MG PO TABS: ORAL_TABLET | ORAL | 0 refills | Status: DC

## 2019-09-07 MED ORDER — ENSURE PRE-SURGERY PO LIQD: 296.0000 mL | Freq: Once | ORAL | Status: DC

## 2019-09-07 MED ORDER — SIMETHICONE 80 MG PO CHEW
80.0000 mg | CHEWABLE_TABLET | Freq: Four times a day (QID) | ORAL | Status: DC | PRN
  Administered 2019-09-07: 80 mg via ORAL

## 2019-09-07 MED ORDER — ROCURONIUM BROMIDE 10 MG/ML (PF) SYRINGE
PREFILLED_SYRINGE | INTRAVENOUS | Status: DC | PRN
  Administered 2019-09-07: 20 mg via INTRAVENOUS
  Administered 2019-09-07: 70 mg via INTRAVENOUS
  Administered 2019-09-07: 20 mg via INTRAVENOUS

## 2019-09-07 MED ORDER — LABETALOL HCL 5 MG/ML IV SOLN
INTRAVENOUS | Status: AC
  Filled 2019-09-07: qty 4

## 2019-09-07 MED ORDER — SIMETHICONE 80 MG PO CHEW
CHEWABLE_TABLET | ORAL | Status: AC
  Filled 2019-09-07: qty 1

## 2019-09-07 MED ORDER — ARTIFICIAL TEARS OPHTHALMIC OINT
TOPICAL_OINTMENT | OPHTHALMIC | Status: AC
  Filled 2019-09-07: qty 3.5

## 2019-09-07 MED ORDER — FENTANYL CITRATE (PF) 100 MCG/2ML IJ SOLN
INTRAMUSCULAR | Status: DC | PRN
  Administered 2019-09-07: 100 ug via INTRAVENOUS
  Administered 2019-09-07 (×3): 50 ug via INTRAVENOUS

## 2019-09-07 SURGICAL SUPPLY — 75 items
ADH SKN CLS APL DERMABOND .7 (GAUZE/BANDAGES/DRESSINGS) ×1
BARRIER ADHS 3X4 INTERCEED (GAUZE/BANDAGES/DRESSINGS) ×3 IMPLANT
BLADE LAP MORCELLATOR 15MMX9.5 (ELECTROSURGICAL)
BLADE LAP MORCELLATOR 15X9.5 (ELECTROSURGICAL) IMPLANT
BLADE MORCELLATOR EXT  12.5X15 (ELECTROSURGICAL)
BLADE MORCELLATOR EXT 12.5X15 (ELECTROSURGICAL) IMPLANT
BRR ADH 4X3 ABS CNTRL BYND (GAUZE/BANDAGES/DRESSINGS) ×1
CANISTER SUCT 3000ML PPV (MISCELLANEOUS) ×1 IMPLANT
CATH FOLEY 3WAY  5CC 16FR (CATHETERS) ×3
CATH FOLEY 3WAY 5CC 16FR (CATHETERS) IMPLANT
CLOSURE WOUND 1/4X4 (GAUZE/BANDAGES/DRESSINGS) ×1
COVER BACK TABLE 60X90IN (DRAPES) ×3 IMPLANT
COVER TIP SHEARS 8 DVNC (MISCELLANEOUS) ×1 IMPLANT
COVER TIP SHEARS 8MM DA VINCI (MISCELLANEOUS) ×3
COVER WAND RF STERILE (DRAPES) ×3 IMPLANT
DECANTER SPIKE VIAL GLASS SM (MISCELLANEOUS) ×6 IMPLANT
DEFOGGER SCOPE WARMER CLEARIFY (MISCELLANEOUS) ×3 IMPLANT
DERMABOND ADVANCED (GAUZE/BANDAGES/DRESSINGS) ×2
DERMABOND ADVANCED .7 DNX12 (GAUZE/BANDAGES/DRESSINGS) ×1 IMPLANT
DILATOR CANAL MILEX (MISCELLANEOUS) IMPLANT
DRAPE ARM DVNC X/XI (DISPOSABLE) ×4 IMPLANT
DRAPE COLUMN DVNC XI (DISPOSABLE) ×1 IMPLANT
DRAPE DA VINCI XI ARM (DISPOSABLE) ×12
DRAPE DA VINCI XI COLUMN (DISPOSABLE) ×3
DURAPREP 26ML APPLICATOR (WOUND CARE) ×3 IMPLANT
ELECT REM PT RETURN 9FT ADLT (ELECTROSURGICAL) ×3
ELECTRODE REM PT RTRN 9FT ADLT (ELECTROSURGICAL) ×1 IMPLANT
GAUZE 4X4 16PLY RFD (DISPOSABLE) ×3 IMPLANT
GAUZE PETROLATUM 1 X8 (GAUZE/BANDAGES/DRESSINGS) ×2 IMPLANT
GLOVE BIO SURGEON STRL SZ7 (GLOVE) ×3 IMPLANT
GLOVE BIOGEL PI IND STRL 7.0 (GLOVE) ×5 IMPLANT
GLOVE BIOGEL PI INDICATOR 7.0 (GLOVE) ×10
GLOVE ECLIPSE 6.5 STRL STRAW (GLOVE) ×9 IMPLANT
IRRIG SUCT STRYKERFLOW 2 WTIP (MISCELLANEOUS) ×3
IRRIGATION SUCT STRKRFLW 2 WTP (MISCELLANEOUS) ×1 IMPLANT
IV SET EXTENSION GRAVITY 40 LF (IV SETS) IMPLANT
LEGGING LITHOTOMY PAIR STRL (DRAPES) ×3 IMPLANT
NDL SPNL 22GX5 LNG QUINC BK (NEEDLE) IMPLANT
NEEDLE HYPO 22GX1.5 SAFETY (NEEDLE) ×3 IMPLANT
NEEDLE SPNL 22GX5 LNG QUINC BK (NEEDLE) ×3 IMPLANT
OBTURATOR OPTICAL STANDARD 8MM (TROCAR) ×3
OBTURATOR OPTICAL STND 8 DVNC (TROCAR) ×1
OBTURATOR OPTICALSTD 8 DVNC (TROCAR) ×1 IMPLANT
OCCLUDER COLPOPNEUMO (BALLOONS) ×3 IMPLANT
PACK ROBOT WH (CUSTOM PROCEDURE TRAY) ×3 IMPLANT
PACK ROBOTIC GOWN (GOWN DISPOSABLE) ×3 IMPLANT
PACK TRENDGUARD 450 HYBRID PRO (MISCELLANEOUS) ×1 IMPLANT
PAD PREP 24X48 CUFFED NSTRL (MISCELLANEOUS) ×3 IMPLANT
PLUG CATH AND CAP STER (CATHETERS) ×2 IMPLANT
POUCH LAPAROSCOPIC INSTRUMENT (MISCELLANEOUS) ×3 IMPLANT
PROTECTOR NERVE ULNAR (MISCELLANEOUS) ×5 IMPLANT
SCISSORS LAP 5X35 DISP (ENDOMECHANICALS) ×2 IMPLANT
SEAL CANN UNIV 5-8 DVNC XI (MISCELLANEOUS) ×4 IMPLANT
SEAL XI 5MM-8MM UNIVERSAL (MISCELLANEOUS) ×12
SEALER VESSEL DA VINCI XI (MISCELLANEOUS)
SEALER VESSEL EXT DVNC XI (MISCELLANEOUS) IMPLANT
SET IRRIG Y TYPE TUR BLADDER L (SET/KITS/TRAYS/PACK) ×2 IMPLANT
SET TRI-LUMEN FLTR TB AIRSEAL (TUBING) ×3 IMPLANT
STRIP CLOSURE SKIN 1/4X4 (GAUZE/BANDAGES/DRESSINGS) ×2 IMPLANT
SUT MNCRL AB 3-0 PS2 27 (SUTURE) ×6 IMPLANT
SUT VIC AB 0 CT1 27 (SUTURE) ×6
SUT VIC AB 0 CT1 27XBRD ANBCTR (SUTURE) ×2 IMPLANT
SUT VICRYL 0 UR6 27IN ABS (SUTURE) ×5 IMPLANT
SUT VLOC 180 0 9IN  GS21 (SUTURE) ×6
SUT VLOC 180 0 9IN GS21 (SUTURE) ×2 IMPLANT
TIP RUMI ORANGE 6.7MMX12CM (TIP) IMPLANT
TIP UTERINE 5.1X6CM LAV DISP (MISCELLANEOUS) IMPLANT
TIP UTERINE 6.7X10CM GRN DISP (MISCELLANEOUS) ×2 IMPLANT
TIP UTERINE 6.7X6CM WHT DISP (MISCELLANEOUS) IMPLANT
TIP UTERINE 6.7X8CM BLUE DISP (MISCELLANEOUS) IMPLANT
TOWEL OR 17X26 10 PK STRL BLUE (TOWEL DISPOSABLE) ×3 IMPLANT
TRAY FOLEY W/BAG SLVR 14FR (SET/KITS/TRAYS/PACK) ×3 IMPLANT
TRENDGUARD 450 HYBRID PRO PACK (MISCELLANEOUS) ×3
TROCAR PORT AIRSEAL 8X120 (TROCAR) ×3 IMPLANT
WATER STERILE IRR 1000ML POUR (IV SOLUTION) ×3 IMPLANT

## 2019-09-07 NOTE — Interval H&P Note (Signed)
History and Physical Interval Note:  09/07/2019 11:44 AM  Shirley Savage  has presented today for surgery, with the diagnosis of Menorrhagia and Chroic pelvic Pain, Andenomyosis.  The various methods of treatment have been discussed with the patient and family. After consideration of risks, benefits and other options for treatment, the patient has consented to  Procedure(s) with comments: XI ROBOTIC ASSISTED LAPAROSCOPIC HYSTERECTOMY AND SALPINGECTOMY (Bilateral) - Bed held, but patient is expected to go home the same day. -ap as a surgical intervention.  The patient's history has been reviewed, patient examined, no change in status, stable for surgery.  I have reviewed the patient's chart and labs.  Questions were answered to the patient's satisfaction.     Katharine Look A Neala Miggins

## 2019-09-07 NOTE — Discharge Instructions (Signed)
Call [facility] @ [phone] if:  You have a temperature greater than or equal to 100.4 degrees Farenheit orally You have pain that is not made better by the pain medication given and taken as directed You have excessive bleeding or problems urinating  Take Colace (Docusate Sodium/Stool Softener) 100 mg 2-3 times daily while taking narcotic pain medicine to avoid constipation or until bowel movements are regular.  Take together, with food for the next 5 days:  Ibuprofen 600 mg  take 1 po every 6 hours and Acetaminophen 500 mg  #2 tablets every 6 hours  You may drive after 1 week You may walk up steps  You may shower tomorrow You may resume a regular diet  Keep incisions clean and dry Do not lift over 15 pounds for 6 weeks Avoid anything in vagina for 6 weeks (or until after your post-operative visit)

## 2019-09-07 NOTE — Progress Notes (Signed)
Patient ambulated to [facility] for first attempt at voiding. Upon getting up from toilet, patient became dizzy, lightheaded, and c/o "shaky feeling". Patient had trouble keeping eyes open/staying alert, assisted by 2 RNs back to bed. VS obtained, VSS. Will continue to monitor.

## 2019-09-07 NOTE — Op Note (Signed)
Preoperative diagnosis: Dysfunctional uterine bleeding with suspected adenomyosis  Postoperative diagnosis: Same   Anesthesia: General  Anesthesiologist: Dr. Daiva Huge  Procedure: Robotically assisted total hysterectomy with bilateral salpingectomy  Surgeon: Dr. Katharine Look Gershom Brobeck  Assistant: Earnstine Regal P.A.-C.  Estimated blood loss: 25 cc  Procedure:  After being informed of the planned procedure with possible complications including but not limited to bleeding, infection, injury to other organs, need for laparotomy, possible need for morcellation with risks and benefits reviewed, expected hospital stay and recovery, informed consent is obtained and patient is taken to or #6. She is placed in  lithotomy position on [place] with both arms padded and tucked on each side and bilateral knee-high sequential compressive devices. She is given general anesthesia with endotracheal intubation without any complication. She is prepped and draped in a sterile fashion. A three-way Foley catheter is inserted in her bladder.  Pelvic exam reveals: anteverted bulky uterus with no pelvic mass.  A weighted speculum is inserted in the vagina and the anterior lip of the cervix is grasped with a tenaculum forcep. We proceed with a paracervical block and vaginal infiltration using ropivacaine 0.5% diluted 1 in 1 with saline. The uterus was then sounded at 11 cm. We easily dilate the cervix using Hegar dilator to  #27 which allows for easy placement of the intrauterine RUMI manipulator with a 3.3 KOH ring and a vaginal occluder. The ring is sutured to the cervix with 0 Vicryl.  Trocar placement is decided. We infiltrate right above the umbilicus with 10 cc of ropivacaine per protocol and perform a 10 mm vertical incision which is brought down bluntly to the fascia. The fascia is identified and grasped with Coker forceps. The fascia is incised with Mayo scissors. Peritoneum is entered bluntly. A pursestring suture of 0  Vicryl is placed on the fascia and a 10 mm Hassan trocar is easily inserted in the abdominal cavity held in placed with a Purstring suture. This allows for easy insufflation of a pneumoperitoneum using warmed CO2 at a maximum pressure of 15 mm of mercury. 60 cc of Ropivacaine 0.5 % diluted 1 in 1 is sent in the pelvis and the patient is positioned in reverse Trendelenburg. We then placed two 39mm robotic trocar on the left. Due to extensive adhesions between the omentum and the abdominal wall, it is impossible to place the trocars on the right of the patient. Placing the camera in the outer left trocar, we obtain a good view of the adhesions.Using monopolar laparoscopic scissors, we proceed with systematic lysis of adhesions which releases the omentum completely.We the place one 44mm robotic trocar on the right and one 8  mm patient's side assistant trocar on the right  after infiltrating every site  with ropivacaine per protocol. The robot is docked on the right of the patient after positioning her in Trendelenburg. A Vessel Seal is inserted in arm #4, a Long bipolar forceps is inserted in arm #2 and a prograsp is inserted in arm #1.  Preparation and docking is completed in 70 minutes.  Observation: Uterus is bulky,ovaries are normal, tubes are post Pomeroy sterilization, liver is normal. Appendix is not seen. Gallbladder is absent.  We start on the right side by sealing and cutting the mesosalpynx , the right utero-ovarian ligament and the right round ligament .  This gives [place] entry into the retroperitoneal space with an easy dissection of the anterior broad ligament. The ligament was opened all the way to the left round ligament.  We then proceed with systematic dissection of the bladder from the anterior vaginal cuff which is easily identified with the KOH ring. The plane of dissection is confirmed with filling the bladder with 200 cc of saline. We are able to dissect the bladder 2 cm below the KOH ring.  We then opened the posterior right broad ligament all the way to the posterior KOH ring after identifying the full course of the right ureter.   Moving to the left side we Seal and cut  the left round ligament , the left utero-ovarian ligament and  the mesosalpinx in between. Entry into the retroperitoneal space allows [place] to complete dissection of the bladder on the left side and skeletonized the uterine vessels. The left broad ligament is then dissected all the way to the posterior KOH ring after identifying the full course of the left ureter.  Both tubes are removed from the pelvic cavity through the assistant's trocar.   With pressure on the KOH ring and the bladder fully dissected, we seal and cut the uterine vessels on both sides at the level of the KOH ring.  The vaginal occluder is inflated and we proceed with a 360 colpotomy using an open monopolar scissors and freeing the uterus entirely.  The uterus is delivered vaginally with traction. The vaginal occluder is reinserted in the vagina to maintain pneumoperitoneum.  Instruments are then modified for a suture cut in arm #4 and a long tip forcep in arm #2. We proceed with closure of the vaginal cuff with 2 running sutures of 0 V-Lock. We irrigated profusely with warm saline and confirm a satisfactory hemostasis as well as 2 normal ureters with good mobility and no dilatation.  A sheet of Interceed , divided in 2, is placed on the vaginal cuff and behind the ovaries.  All instruments are then removed and the robot is undocked. Console time: 1 hours and 38 minutes.  All trochars are removed under direct visualization after evacuating the pneumoperitoneum.  The fascia of the supraumbilical incision is closed with the previously placed pursestring suture of 0 Vicryl. All incisions are then closed with subcuticular suture of 3-0 Monocryl and Dermabond.  A speculum is inserted in the vagina to confirm a adequate closure of the vaginal cuff  and good hemostasis.  Instrument and sponge count is complete x2. The procedure is well tolerated by the patient is taken to recovery room in a well and stable condition.  Estimated blood loss: 25 cc  Specimen: Uterus and tubes weighing 163 g sent to pathology

## 2019-09-07 NOTE — Anesthesia Procedure Notes (Signed)
Procedure Name: Intubation Date/Time: 09/07/2019 12:02 PM Performed by: Bonney Aid, CRNA Pre-anesthesia Checklist: Patient identified, Emergency Drugs available, Suction available and Patient being monitored Patient Re-evaluated:Patient Re-evaluated prior to induction Oxygen Delivery Method: Circle system utilized Preoxygenation: Pre-oxygenation with 100% oxygen Induction Type: IV induction Ventilation: Mask ventilation without difficulty Laryngoscope Size: Mac and 3 Grade View: Grade I Tube type: Oral Tube size: 7.0 mm Number of attempts: 1 Airway Equipment and Method: Stylet Placement Confirmation: ETT inserted through vocal cords under direct vision,  positive ETCO2 and breath sounds checked- equal and bilateral Secured at: 20 cm Tube secured with: Tape Dental Injury: Teeth and Oropharynx as per pre-operative assessment

## 2019-09-07 NOTE — Transfer of Care (Signed)
Immediate Anesthesia Transfer of Care Note  Patient: Shirley Savage  Procedure(s) Performed: XI ROBOTIC ASSISTED LAPAROSCOPIC HYSTERECTOMY AND SALPINGECTOMY; LAPAROSCOPIC LYSIS OF ADHESIONS (Bilateral Abdomen)  Patient Location: PACU  Anesthesia Type:General  Level of Consciousness: drowsy  Airway & Oxygen Therapy: Breathing spontaneously, oxygen per face mask Post-op Assessment: Report given to RN and Post -op Vital signs reviewed and stable  Post vital signs: Reviewed and stable  Last Vitals:  Vitals Value Taken Time  BP 148/84 09/07/19 1549  Temp 36.7 C 09/07/19 1549  Pulse 86 09/07/19 1555  Resp 22 09/07/19 1555  SpO2 96 % 09/07/19 1555  Vitals shown include unvalidated device data.  Last Pain:  Vitals:   09/07/19 1549  TempSrc:   PainSc: 0-No pain      Patients Stated Pain Goal: 6 ([id number])  Complications: No apparent anesthesia complications

## 2019-09-08 LAB — SURGICAL PATHOLOGY

## 2019-09-08 NOTE — Anesthesia Postprocedure Evaluation (Signed)
Anesthesia Post Note  Patient: Shirley Savage  Procedure(s) Performed: XI ROBOTIC ASSISTED LAPAROSCOPIC HYSTERECTOMY AND SALPINGECTOMY; LAPAROSCOPIC LYSIS OF ADHESIONS (Bilateral Abdomen)     Patient location during evaluation: PACU Anesthesia Type: General Level of consciousness: awake and alert Pain management: pain level controlled Vital Signs Assessment: post-procedure vital signs reviewed and stable Respiratory status: spontaneous breathing, nonlabored ventilation, respiratory function stable and patient connected to nasal cannula oxygen Cardiovascular status: blood pressure returned to baseline and stable Postop Assessment: no apparent nausea or vomiting Anesthetic complications: no    Last Vitals:  Vitals:   09/07/19 2125 09/07/19 2139  BP: (!) 173/80   Pulse: 92   Resp: (!) 22   Temp: 36.7 C 36.8 C  SpO2: 98%     Last Pain:  Vitals:   09/08/19 1044  TempSrc:   PainSc: 6                  Ellsie Violette S

## 2019-09-08 NOTE — Addendum Note (Signed)
Addendum  created 09/08/19 1403 by Suan Halter, CRNA   Charge Capture section accepted

## 2019-09-11 NOTE — Addendum Note (Signed)
Addendum  created 09/11/19 1324 by Lyn Hollingshead, MD   Intraprocedure Staff edited

## 2020-01-04 ENCOUNTER — Other Ambulatory Visit: Payer: Self-pay

## 2020-01-04 DIAGNOSIS — Z20822 Contact with and (suspected) exposure to covid-19: Secondary | ICD-10-CM

## 2020-01-08 LAB — NOVEL CORONAVIRUS, NAA

## 2020-01-11 ENCOUNTER — Other Ambulatory Visit: Payer: Self-pay

## 2020-12-11 DIAGNOSIS — M7542 Impingement syndrome of left shoulder: Secondary | ICD-10-CM | POA: Diagnosis not present

## 2021-01-08 DIAGNOSIS — M7542 Impingement syndrome of left shoulder: Secondary | ICD-10-CM | POA: Diagnosis not present

## 2021-05-01 DIAGNOSIS — Z01419 Encounter for gynecological examination (general) (routine) without abnormal findings: Secondary | ICD-10-CM | POA: Diagnosis not present

## 2021-05-01 DIAGNOSIS — Z1211 Encounter for screening for malignant neoplasm of colon: Secondary | ICD-10-CM | POA: Diagnosis not present

## 2021-05-01 DIAGNOSIS — Z6841 Body Mass Index (BMI) 40.0 and over, adult: Secondary | ICD-10-CM | POA: Diagnosis not present

## 2021-05-01 DIAGNOSIS — Z1231 Encounter for screening mammogram for malignant neoplasm of breast: Secondary | ICD-10-CM | POA: Diagnosis not present

## 2021-05-01 DIAGNOSIS — K432 Incisional hernia without obstruction or gangrene: Secondary | ICD-10-CM | POA: Diagnosis not present

## 2021-05-14 DIAGNOSIS — K432 Incisional hernia without obstruction or gangrene: Secondary | ICD-10-CM | POA: Diagnosis not present

## 2021-05-14 DIAGNOSIS — E119 Type 2 diabetes mellitus without complications: Secondary | ICD-10-CM | POA: Diagnosis not present

## 2021-05-16 ENCOUNTER — Other Ambulatory Visit: Payer: Self-pay | Admitting: General Surgery

## 2021-05-16 DIAGNOSIS — K432 Incisional hernia without obstruction or gangrene: Secondary | ICD-10-CM

## 2021-05-16 DIAGNOSIS — E119 Type 2 diabetes mellitus without complications: Secondary | ICD-10-CM

## 2021-05-28 DIAGNOSIS — K432 Incisional hernia without obstruction or gangrene: Secondary | ICD-10-CM | POA: Diagnosis not present

## 2021-05-28 DIAGNOSIS — E119 Type 2 diabetes mellitus without complications: Secondary | ICD-10-CM | POA: Diagnosis not present

## 2021-06-27 ENCOUNTER — Other Ambulatory Visit (HOSPITAL_BASED_OUTPATIENT_CLINIC_OR_DEPARTMENT_OTHER): Payer: Self-pay | Admitting: Nurse Practitioner

## 2021-06-27 ENCOUNTER — Other Ambulatory Visit: Payer: Self-pay

## 2021-06-27 ENCOUNTER — Ambulatory Visit (HOSPITAL_BASED_OUTPATIENT_CLINIC_OR_DEPARTMENT_OTHER): Payer: BC Managed Care – PPO | Admitting: Nurse Practitioner

## 2021-06-27 ENCOUNTER — Encounter (HOSPITAL_BASED_OUTPATIENT_CLINIC_OR_DEPARTMENT_OTHER): Payer: Self-pay | Admitting: Nurse Practitioner

## 2021-06-27 VITALS — BP 122/82 | HR 86 | Temp 98.0°F | Ht 63.0 in | Wt 244.6 lb

## 2021-06-27 DIAGNOSIS — Z8349 Family history of other endocrine, nutritional and metabolic diseases: Secondary | ICD-10-CM

## 2021-06-27 DIAGNOSIS — Z8639 Personal history of other endocrine, nutritional and metabolic disease: Secondary | ICD-10-CM | POA: Diagnosis not present

## 2021-06-27 DIAGNOSIS — E1169 Type 2 diabetes mellitus with other specified complication: Secondary | ICD-10-CM

## 2021-06-27 DIAGNOSIS — K439 Ventral hernia without obstruction or gangrene: Secondary | ICD-10-CM

## 2021-06-27 DIAGNOSIS — D500 Iron deficiency anemia secondary to blood loss (chronic): Secondary | ICD-10-CM | POA: Insufficient documentation

## 2021-06-27 DIAGNOSIS — Z0000 Encounter for general adult medical examination without abnormal findings: Secondary | ICD-10-CM

## 2021-06-27 DIAGNOSIS — E1065 Type 1 diabetes mellitus with hyperglycemia: Secondary | ICD-10-CM

## 2021-06-27 MED ORDER — FREESTYLE LIBRE 2 READER DEVI: 11 refills | Status: AC

## 2021-06-27 MED ORDER — ONDANSETRON HCL 8 MG PO TABS: 8.0000 mg | ORAL_TABLET | Freq: Three times a day (TID) | ORAL | 2 refills | Status: DC | PRN

## 2021-06-27 MED ORDER — TIRZEPATIDE 5 MG/0.5ML ~~LOC~~ SOAJ: 5.0000 mg | SUBCUTANEOUS | 0 refills | Status: DC

## 2021-06-27 MED ORDER — TIRZEPATIDE 2.5 MG/0.5ML ~~LOC~~ SOAJ: 2.5000 mg | SUBCUTANEOUS | 0 refills | Status: DC

## 2021-06-27 MED ORDER — FREESTYLE LIBRE 2 SENSOR MISC: 12 refills | Status: DC

## 2021-06-27 NOTE — Assessment & Plan Note (Signed)
Large ventral hernia present with no signs of obstruction.  ?Awaiting CT scan ordered by general surgery.  ?Surgical intervention will require weight loss and god glycemic control prior to consideration.  ?Starting [facility] today to help with both along with diet and exercise. Will plan to follow-up in 4 weeks for monitoring.  ?

## 2021-06-27 NOTE — Patient Instructions (Addendum)
DIABETES ?High blood sugar can damage your organs, blood vessels, and nerves, slow wound healing, and increase your risk of infection, among many other things.  ?The risk of having a heart attack and/or stroke is [facility] higher if you have diabetes with uncontrolled blood sugars.  ?To help reduce your risks and keep you healthy, we must work together to get your blood sugar levels under control with diet, exercise, and medication.   ? ?The most important and effective way to control diabetes are through diet changes and regular exercise.  ? ?What is Happening With Diabetes? ?Foods high in carbohydrates (sugars, starches, bread, pasta, potatoes, soda, fruit juices, etc) break down into a sugar called glucose once in your body. ?Glucose is used by the cells in your body for fuel to have the energy they need to work properly.  ?As the glucose is released into your blood stream during digestion, your blood sugar goes up. This is called hyperglycemia.  ? ?Insulin is a hormone in the body that works as a key to unlock the cell and allow the glucose in.  ?Normally, insulin is released in response to rising blood glucose levels.  ? ?In people with diabetes, either the cells have changed the locks and don't open with the insulin key or there is not enough insulin made by the body to use all of the glucose in the blood.  ? ?This means that the glucose never makes it into the cells for fuel and stays in the blood. ?The high levels of glucose in the blood are like a poison to your organs and blood vessels and over time permanent damage starts to occur. ? ?What Kind of Diet is Best for Diabetes? ?A person with diabetes must limit the amount of carbohydrates and sugar eaten to help prevent high blood glucose levels.    ? ?You should aim for less than 1/2 of your total calorie intake per day to come from carbohydrates.  ?What this means is, if you eat a 1200-1500 calorie diet, you will want less than 600-750 of those calories to come  from carbohydrates. That equals to about 150-200 grams of carbohydrates per day.  ? ?GOAL: Eat 1200-1500 calories a day with 150-200 grams of carbohydrates or less.  ? ?Reading labels is very important to help understand how many carbohydrates are in certain foods. There are also tables available online for restaurant foods that may help when you are eating out.  ? ?Important foods to INCREASE in your diet are lean meats, protein, and vegetables. ?It can be very helpful to measure the food you are eating by the recommended serving size on packages to make sure you are not overeating and monitor your calories and carbohydrates.  ? ?How Does Exercise Help with Diabetes? ?When you exercise, the cells in your body use up more energy. This means that they need more fuel to keep going. The cells that can still use the insulin key, take in more of the glucose from the blood for energy and the blood glucose levels go down.  ? ?Excess fat cells can be the cause of the insulin key no longer working. Exercise and weight loss can change the locks back to allow the insulin key to work again.  ? ?Enough weight loss can sometimes get rid of diabetes! ? ?What Kind of Exercise is Best for Diabetes? ?I recommend starting out with moderate exercise, like walking.  ?Walking every single day for at least 15-20 minutes can be enough to  get you up and moving without wearing you out.  ?You want to walk at a pace that you can carry on a conversation without being too out of breath, but that you also get your heart rate up and break a sweat.  ?As you get used to daily walking, you should increase how far, how fast, and how long you walk.   ? ?Monitoring your blood sugars helps you have an understanding of how your diet and activity levels are affecting your blood sugar. Certain foods that you think may not increase your blood sugar really make a difference. By monitoring your sugar when you eat, you can see how different foods affect your  numbers. This is very important when you first start treatment to get a good understanding of what foods are good and what foods you should limit.  ? ?I would like you to monitor your blood sugar every morning before eating and write down the number to bring with you to your next visit. This will help [place] determine if we need to make changes to the medication and diet.  ? ?You may also want to check your blood sugar after meals to see how certain foods are affecting the numbers. Excellent blood sugar goals are between 90-120 when you have not eaten and less than 160 1-2 hours after a meal. If you are checking your blood sugars 1-2 hours after a meal and they are higher than this, this tells [place] that we need better control. If you are waking up with blood sugars above 120, we need to look at your diet the day before to see if this could be affecting the numbers.  ? ?Medication is the key to help control your blood sugars. With diabetes, your body is not using insulin like it should to help the glucose (sugar) get into the cells for fuel. Medications help your body produce more insulin and make your cells more receptive to insulin so that the glucose in your body can be used instead of remaining in the blood.  ?The first line medication is called Metformin. This is a pill that you take daily, usually twice a day, to help your body properly use glucose. If good control is not achieved with metformin, other medications can be added or changed to help with better control.  ? ?Management of cholesterol is vital to reduce your risks of heart attack and stroke. Medication to control your cholesterol is also very important and necessary for your overall health. ? ?As a new diabetic, we will plan to follow-up every 3 months to check your hemoglobin A1c, which gives me an average of your blood sugars over the past 3 months to help determine your control. Once your blood sugars are well controlled, we can go down to checking  every 6 months.  ?We also need to closely monitor your feet, kidney function, and cholesterol as these are all affected from diabetes. ? ? ? ? ? ?Thank you for choosing [place] [facility] for your Primary Care needs. I am excited for the opportunity to partner with you to meet your health care goals. It was a pleasure meeting you today! ? ?Recommendations from today's visit: ?I want you to read over the information I have given you and let me know if you have any questions. We will work to get your blood sugars under control and get you feeling better soon!! ? ?Information on diet, exercise, and health maintenance recommendations are listed below.  This is information to help you be sure you are on track for optimal health and monitoring.  ? ?Please look over this and let us know if you have any questions or if you have completed any of the health maintenance outside of [place] so that we can be sure your records are up to date.  ?___________________________________________________________ ?About Me: ?I am an Adult-Geriatric Nurse Practitioner with a background in caring for patients for more than 20 years with a strong intensive care background. I provide primary care and sports medicine services to patients age 38 and older within this office. My education had a strong focus on caring for the older adult population, which I am passionate about. I am also the director of the APP Fellowship with [facility].  ? ?My desire is to provide you with the best service through preventive medicine and supportive care. I consider you a part of the medical team and value your input. I work diligently to ensure that you are heard and your needs are met in a safe and effective manner. I want you to feel comfortable with me as your provider and want you to know that your health concerns are important to me. ? ?For your information, our office hours are: ?Monday, Tuesday, and Thursday 8:00 AM - 5:00  PM ?Wednesday and Friday 8:00 AM - 12:00 PM.  ? ?In my time away from the office I am teaching new APP's within the system and am unavailable, but my partner, Dr. Burnard Bunting is in the office for emergent needs

## 2021-06-27 NOTE — Progress Notes (Signed)
?Orma Render, DNP, AGNP-c ?Primary Care & Sports Medicine ?[place], [place] [address] ?[phone] ? ?New patient visit ? ? ?Patient: Shirley Savage   DOB: December 17, 1965   56 y.o. Female  MRN: [id number] ?Visit Date: 06/27/2021 ? ?Patient Care Team: ?Amine Adelson, Coralee Pesa, NP as PCP - General (Nurse Practitioner) ? ?Today's healthcare provider: Orma Render, NP  ? ?Chief Complaint  ?Patient presents with  ? New Patient (Initial Visit)  ?  Establish care  ? ?Subjective  ?  ?Shirley Savage is a 56 y.o. female who presents today as a new patient to establish care.  ?  ?Patient endorses the following concerns presently: ?History of Pre-DM ?- previously on metformin, but has not been on this in a while.  ?- no recent labs ?- not monitoring diet or exercise at this time ?- increased hunger, no increased thirst or urination reported ?- drinks sweetened tea daily ? ?Hernia ?- has seen general surgery for this ?- abd CT has been ordered and saving up money to pay for this ?- will have surgery once the CT is complete ?- GS recommended she get her BG under control and work on weight loss prior to surgery ?- recommended she discuss Mounjaro or similar for options ?- abdominal protrusion bothersome cosmetically, but no concerns with BM changes ?- occasionally wears abdominal binder to help ? ?History of endometriosis and adenomyosis- hysterectomy ?Gall bladder - removed  ? ?History reviewed and reveals the following: ?Past Medical History:  ?Diagnosis Date  ? ABDOMINAL PAIN, HX OF 11/27/2006  ? Annotation: right lower quadrant pain Qualifier: Diagnosis of  By: Radene Ou MD, [place]    ? ABSCESS, BREAST, RIGHT 11/13/2008  ? Qualifier: Diagnosis of  By: Jorene Minors, [place]    ? Anemia   ? ANKLE SPRAIN, LEFT 07/05/2007  ? Qualifier: Diagnosis of  By: Radene Ou MD, [place]    ? Arthritis   ? right knee  ? Diabetes mellitus without complication ([place])   ? type 2  ? Dysuria 02/10/2007  ? Qualifier:  Diagnosis of  By: Hassell Done FNP, Tori Milks    ? Fibroids   ? KNEE PAIN, LEFT 07/05/2007  ? Qualifier: Diagnosis of  By: Radene Ou MD, [place]    ? LABYRINTHITIS, ACUTE 03/12/2010  ? Qualifier: Diagnosis of  By: Amil Amen MD, Benjamine Mola    ? MUSCLE SPASM, BACK 03/20/2008  ? Qualifier: Diagnosis of  By: Radene Ou MD, [place]    ? Obesity   ? SINUSITIS, ACUTE 03/12/2010  ? Qualifier: Diagnosis of  By: Amil Amen MD, Benjamine Mola    ? UTI 07/24/2009  ? Qualifier: Diagnosis of  By: Jorene Minors, Scott    ? ?Past Surgical History:  ?Procedure Laterality Date  ? CESAREAN SECTION    ? x3  ? CHOLECYSTECTOMY    ? HYSTEROSCOPY WITH D & C  11/12/2011  ? Procedure: DILATATION AND CURETTAGE /HYSTEROSCOPY;  Surgeon: Mora Bellman, MD;  Location: [facility];  Service: Gynecology;  Laterality: N/A;  ? ROBOTIC ASSISTED LAPAROSCOPIC HYSTERECTOMY AND SALPINGECTOMY Bilateral 09/07/2019  ? Procedure: XI ROBOTIC ASSISTED LAPAROSCOPIC HYSTERECTOMY AND SALPINGECTOMY; LAPAROSCOPIC LYSIS OF ADHESIONS;  Surgeon: Delsa Bern, MD;  Location: [place];  Service: Gynecology;  Laterality: Bilateral;  Bed held, but patient is expected to go home the same day. -ap  ? TUBAL LIGATION    ? WISDOM TOOTH EXTRACTION    ? ?Family Status  ?Relation Name Status  ? Neg Hx  (Not Specified)  ? ?  Family History  ?Problem Relation Age of Onset  ? Anesthesia problems Neg Hx   ? ?Social History  ? ?Socioeconomic History  ? Marital status: Married  ?  Spouse name: Not on file  ? Number of children: Not on file  ? Years of education: Not on file  ? Highest education level: Not on file  ?Occupational History  ? Not on file  ?Tobacco Use  ? Smoking status: Never  ? Smokeless tobacco: Never  ?Vaping Use  ? Vaping Use: Never used  ?Substance and Sexual Activity  ? Alcohol use: Yes  ?  Comment: once a month; wine or vodka drink very occasionally  ? Drug use: No  ? Sexual activity: Not Currently  ?  Birth control/protection: Surgical  ?Other Topics Concern  ? Not on file   ?Social History Narrative  ? Not on file  ? ?Social Determinants of Health  ? ?Financial Resource Strain: Not on file  ?Food Insecurity: Not on file  ?Transportation Needs: Not on file  ?Physical Activity: Not on file  ?Stress: Not on file  ?Social Connections: Not on file  ? ?Outpatient Medications Prior to Visit  ?Medication Sig  ? acetaminophen (TYLENOL) 500 MG tablet take 2 tablet po every 6 hours for 5 days then prn-post operative pain  ? [DISCONTINUED] atorvastatin (LIPITOR) 10 MG tablet Take 1 tablet (10 mg total) by mouth daily.  ? [DISCONTINUED] Ferrous Sulfate (IRON PO) Take 1 tablet by mouth daily.   ? [DISCONTINUED] glucose blood (ONETOUCH VERIO) test strip Test 1-2 times a day. Pt uses onetouch verio flex meter  ? [DISCONTINUED] ibuprofen (ADVIL) 600 MG tablet take 1 tablet po pc every 6 hours for  5 days then prn- post operative pain  ? [DISCONTINUED] metFORMIN (GLUCOPHAGE) 500 MG tablet TAKE 1 TABLET BY MOUTH TWICE DAILY WITH MEALS (Patient taking differently: 500 mg 2 (two) times daily with a meal. )  ? [DISCONTINUED] OneTouch Delica Lancets 10F MISC 1 each by Does not apply route 2 (two) times daily. Test 1-2 times a day. Pt uses onetouch verio meter  ? [DISCONTINUED] oxyCODONE (ROXICODONE) 5 MG immediate release tablet take 1 tablet po every 4-6 hours as needed for breakthrough post operative pain  ? ?No facility-administered medications prior to visit.  ? ?Allergies  ?Allergen Reactions  ? Other   ?  NO BLOOD PRODUCTS  REFUSAL  ? ?Immunization History  ?Administered Date(s) Administered  ? Influenza Whole 03/12/2010  ? Pneumococcal Polysaccharide-23 01/27/2019  ? Td 05/28/2008  ? ? ?Review of Systems ?All review of systems negative except what is listed in the HPI ? ? Objective  ?  ?BP 122/82   Pulse 86   Temp 98 ?F (36.7 ?C)   Ht '5\' 3"'$  (1.6 m)   Wt 244 lb 9.6 oz (110.9 kg)   LMP 08/04/2016   SpO2 97%   BMI 43.33 kg/m?  ?Physical Exam ?Vitals and nursing note reviewed.  ?Constitutional:    ?   Appearance: Normal appearance. She is obese.  ?HENT:  ?   Head: Normocephalic.  ?Eyes:  ?   Extraocular Movements: Extraocular movements intact.  ?   Conjunctiva/sclera: Conjunctivae normal.  ?   Pupils: Pupils are equal, round, and reactive to light.  ?Neck:  ?   Vascular: No carotid bruit.  ?Cardiovascular:  ?   Rate and Rhythm: Normal rate and regular rhythm.  ?   Pulses: Normal pulses.  ?   Heart sounds: Normal heart sounds. No murmur heard. ?  Pulmonary:  ?   Effort: Pulmonary effort is normal.  ?   Breath sounds: Normal breath sounds. No wheezing.  ?Abdominal:  ?   General: There is distension.  ?   Tenderness: There is no abdominal tenderness. There is no guarding or rebound.  ?   Hernia: A hernia is present.  ?Musculoskeletal:  ?   Cervical back: Normal range of motion.  ?   Right lower leg: No edema.  ?   Left lower leg: No edema.  ?Lymphadenopathy:  ?   Cervical: No cervical adenopathy.  ?Skin: ?   Capillary Refill: Capillary refill takes less than 2 seconds.  ?Neurological:  ?   General: No focal deficit present.  ?   Mental Status: She is alert and oriented to person, place, and time.  ?Psychiatric:     ?   Mood and Affect: Mood normal.     ?   Behavior: Behavior normal.     ?   Thought Content: Thought content normal.     ?   Judgment: Judgment normal.  ? ? ?No results found for any visits on 06/27/21. ? Assessment & Plan   ?  ? ?Problem List Items Addressed This Visit   ? ? Morbid obesity ([place])  ?  BMI 43.33 in setting of uncontrolled DM2 with hyperglycemia, HLD, and abdominal hernia.  ?At this time recommend working to control BG levels with mounjaro, which should also help with weight control.  ?Plan to start walking 10 minutes every day and increase every 4 weeks by at least 5 minutes.  ?Will obtain further labs today to evaluate lipids.  ?Lengthy discussion on medication and diseases as well as treatment and management.  ?Plan to f/u in 4 weeks.  ? ?  ?  ? Relevant Medications  ? tirzepatide  (MOUNJARO) 2.5 MG/0.5ML Pen  ? ondansetron (ZOFRAN) 8 MG tablet  ? Continuous Blood Gluc Sensor (FREESTYLE LIBRE 2 SENSOR) MISC  ? Continuous Blood Gluc Receiver (FREESTYLE LIBRE 2 READER) DEVI  ? Other Relevant Orders  ? C

## 2021-06-27 NOTE — Assessment & Plan Note (Addendum)
Chronic. Uncontrolled. POC A1c 8.5% in office today. ?Condition complicated by hyperglycemia, hernia, obesity, and HLD.  ?Extensive discussion on condition with patient today along with education on medications that may be effective to help with control.  ?Education on diet and activity discussed.  ?Informational handouts provided on diet, exercise, goal BG levels, hyperglycemia, hypoglycemia, management, sick day, and medication options.  ?Recommendations reviewed with patient as well as BG monitoring techniques.  ?FreeStyle Libre applied to patient today and assistance with activation and set up were provided.  ?Samples for Mounjaro starter pack were provided for patient today.  ?Recommend the following: ?Carbohydrates less than 150 grams a day ?Low glycemic index sweetener- count carbohydrates from drinks ?Avoid skipping meals- divide carbohydrates throughout the day ?Goal BG: less than 120 in the morning when fasting, less than 180 2 hours after a meal ?Monitor what foods cause changes in BG ?Walk at least 20 minutes every day ?Increase fiber to at least 30g a day ?Decrease saturated fat to no more than 13g a day ?We will plan to f/u in 4 weeks with further education and discussion on disease process and management.  ?

## 2021-06-27 NOTE — Assessment & Plan Note (Addendum)
BMI 43.33 in setting of uncontrolled DM2 with hyperglycemia, HLD, and abdominal hernia.  ?At this time recommend working to control BG levels with mounjaro, which should also help with weight control.  ?Plan to start walking 10 minutes every day and increase every 4 weeks by at least 5 minutes.  ?Will obtain further labs today to evaluate lipids.  ?Lengthy discussion on medication and diseases as well as treatment and management.  ?Plan to f/u in 4 weeks.  ? ?

## 2021-06-27 NOTE — Assessment & Plan Note (Signed)
Will obtain labs today for evaluation.  ?Suspect levels will be elevated given uncontrolled diabetes and obesity factors.  ?Recommendations for dietary reduction of lipids discussed and provided to patient on handout.  ?Will plan to f/u in 4 weeks and consider low dose statin therapy.  ?

## 2021-06-27 NOTE — Assessment & Plan Note (Signed)
Will monitor levels today. No hx of thyroid carcinoma. ? ?

## 2021-06-30 DIAGNOSIS — Z8639 Personal history of other endocrine, nutritional and metabolic disease: Secondary | ICD-10-CM | POA: Diagnosis not present

## 2021-06-30 DIAGNOSIS — Z8349 Family history of other endocrine, nutritional and metabolic diseases: Secondary | ICD-10-CM | POA: Diagnosis not present

## 2021-06-30 DIAGNOSIS — E1065 Type 1 diabetes mellitus with hyperglycemia: Secondary | ICD-10-CM | POA: Diagnosis not present

## 2021-07-01 ENCOUNTER — Encounter (HOSPITAL_BASED_OUTPATIENT_CLINIC_OR_DEPARTMENT_OTHER): Payer: Self-pay | Admitting: Nurse Practitioner

## 2021-07-01 LAB — COMPREHENSIVE METABOLIC PANEL
ALT: 25 IU/L (ref 0–32)
AST: 22 IU/L (ref 0–40)
Albumin/Globulin Ratio: 1.7 (ref 1.2–2.2)
Albumin: 4.1 g/dL (ref 3.8–4.9)
Alkaline Phosphatase: 88 IU/L (ref 44–121)
BUN/Creatinine Ratio: 17 (ref 9–23)
BUN: 10 mg/dL (ref 6–24)
Bilirubin Total: 0.5 mg/dL (ref 0.0–1.2)
CO2: 23 mmol/L (ref 20–29)
Calcium: 9.1 mg/dL (ref 8.7–10.2)
Chloride: 105 mmol/L (ref 96–106)
Creatinine, Ser: 0.58 mg/dL (ref 0.57–1.00)
Globulin, Total: 2.4 g/dL (ref 1.5–4.5)
Glucose: 184 mg/dL — ABNORMAL HIGH (ref 70–99)
Potassium: 4.3 mmol/L (ref 3.5–5.2)
Sodium: 141 mmol/L (ref 134–144)
Total Protein: 6.5 g/dL (ref 6.0–8.5)
eGFR: 107 mL/min/{1.73_m2} (ref >59)

## 2021-07-01 LAB — LIPID PANEL
Chol/HDL Ratio: 4 ratio (ref 0.0–4.4)
Cholesterol, Total: 183 mg/dL (ref 100–199)
HDL: 46 mg/dL (ref >39)
LDL Chol Calc (NIH): 112 mg/dL — ABNORMAL HIGH (ref 0–99)
Triglycerides: 143 mg/dL (ref 0–149)
VLDL Cholesterol Cal: 25 mg/dL (ref 5–40)

## 2021-07-01 LAB — CBC WITH DIFFERENTIAL/PLATELET
Basophils Absolute: 0 10*3/uL (ref 0.0–0.2)
Basos: 0 %
EOS (ABSOLUTE): 0.1 10*3/uL (ref 0.0–0.4)
Eos: 2 %
Hematocrit: 37.7 % (ref 34.0–46.6)
Hemoglobin: 12.6 g/dL (ref 11.1–15.9)
Immature Grans (Abs): 0 10*3/uL (ref 0.0–0.1)
Immature Granulocytes: 0 %
Lymphocytes Absolute: 2.5 10*3/uL (ref 0.7–3.1)
Lymphs: 44 %
MCH: 29.2 pg (ref 26.6–33.0)
MCHC: 33.4 g/dL (ref 31.5–35.7)
MCV: 87 fL (ref 79–97)
Monocytes Absolute: 0.5 10*3/uL (ref 0.1–0.9)
Monocytes: 8 %
Neutrophils Absolute: 2.7 10*3/uL (ref 1.4–7.0)
Neutrophils: 46 %
Platelets: 231 10*3/uL (ref 150–450)
RBC: 4.32 x10E6/uL (ref 3.77–5.28)
RDW: 14.4 % (ref 11.7–15.4)
WBC: 5.8 10*3/uL (ref 3.4–10.8)

## 2021-07-01 LAB — IRON,TIBC AND FERRITIN PANEL
Ferritin: 236 ng/mL — ABNORMAL HIGH (ref 15–150)
Iron Saturation: 21 % (ref 15–55)
Iron: 57 ug/dL (ref 27–159)
Total Iron Binding Capacity: 274 ug/dL (ref 250–450)
UIBC: 217 ug/dL (ref 131–425)

## 2021-07-01 LAB — TSH: TSH: 1.01 u[IU]/mL (ref 0.450–4.500)

## 2021-07-01 LAB — VITAMIN D 25 HYDROXY (VIT D DEFICIENCY, FRACTURES): Vit D, 25-Hydroxy: 15.7 ng/mL — ABNORMAL LOW (ref 30.0–100.0)

## 2021-07-02 ENCOUNTER — Other Ambulatory Visit (HOSPITAL_BASED_OUTPATIENT_CLINIC_OR_DEPARTMENT_OTHER): Payer: Self-pay | Admitting: Nurse Practitioner

## 2021-07-02 DIAGNOSIS — E559 Vitamin D deficiency, unspecified: Secondary | ICD-10-CM

## 2021-07-02 DIAGNOSIS — E1169 Type 2 diabetes mellitus with other specified complication: Secondary | ICD-10-CM

## 2021-07-02 DIAGNOSIS — E1165 Type 2 diabetes mellitus with hyperglycemia: Secondary | ICD-10-CM

## 2021-07-02 MED ORDER — VITAMIN D (ERGOCALCIFEROL) 1.25 MG (50000 UNIT) PO CAPS: 50000.0000 [IU] | ORAL_CAPSULE | ORAL | 0 refills | Status: DC

## 2021-07-02 MED ORDER — ROSUVASTATIN CALCIUM 10 MG PO TABS: 10.0000 mg | ORAL_TABLET | Freq: Every day | ORAL | 3 refills | Status: AC

## 2021-07-03 ENCOUNTER — Encounter (HOSPITAL_BASED_OUTPATIENT_CLINIC_OR_DEPARTMENT_OTHER): Payer: Self-pay | Admitting: Nurse Practitioner

## 2021-07-21 ENCOUNTER — Encounter (HOSPITAL_BASED_OUTPATIENT_CLINIC_OR_DEPARTMENT_OTHER): Payer: Self-pay | Admitting: Nurse Practitioner

## 2021-07-25 ENCOUNTER — Encounter (HOSPITAL_BASED_OUTPATIENT_CLINIC_OR_DEPARTMENT_OTHER): Payer: Self-pay | Admitting: Nurse Practitioner

## 2021-07-25 ENCOUNTER — Ambulatory Visit (INDEPENDENT_AMBULATORY_CARE_PROVIDER_SITE_OTHER): Payer: BC Managed Care – PPO | Admitting: Nurse Practitioner

## 2021-07-25 DIAGNOSIS — Z8349 Family history of other endocrine, nutritional and metabolic diseases: Secondary | ICD-10-CM

## 2021-07-25 DIAGNOSIS — Z0000 Encounter for general adult medical examination without abnormal findings: Secondary | ICD-10-CM

## 2021-07-25 DIAGNOSIS — E1165 Type 2 diabetes mellitus with hyperglycemia: Secondary | ICD-10-CM | POA: Diagnosis not present

## 2021-07-25 DIAGNOSIS — E1065 Type 1 diabetes mellitus with hyperglycemia: Secondary | ICD-10-CM | POA: Diagnosis not present

## 2021-07-25 DIAGNOSIS — Z8639 Personal history of other endocrine, nutritional and metabolic disease: Secondary | ICD-10-CM

## 2021-07-25 MED ORDER — TIRZEPATIDE 7.5 MG/0.5ML ~~LOC~~ SOAJ: 7.5000 mg | SUBCUTANEOUS | 1 refills | Status: DC

## 2021-07-25 NOTE — Patient Instructions (Addendum)
Links for Carbohydrate Counting: ? ?[facility] ?https://www.nebraskamed.com/sites/default/files/documents/Diabetes/2_9212%20Carb%20Counting%20Food%20List.pdf ? ?[facility] ?RepairSelf.es ? ?American Diabetes Association ?https://diabetes.org/healthy-living/recipes-nutrition/understanding-carbs ? ? ?Continue with the Moujaro and FreeStyle ?We will plan to check your blood work in 3 months to see how you are doing ?If you have any questions or concerns, do not hesitate to let me know. ? ? ?

## 2021-07-25 NOTE — Progress Notes (Signed)
Virtual Visit Encounter telephone visit. ? ? ?I connected with  Shirley Savage on 07/25/21 at  8:10 AM EDT by secure audio telemedicine application. I verified that I am speaking with the correct person using two identifiers. ?  ?I introduced myself as a Designer, jewellery with the practice. The limitations of evaluation and management by telemedicine discussed with the patient and the availability of in person appointments. The patient expressed verbal understanding and consent to proceed. ? ?Participating parties in this visit include: Myself and patient ? ?The patient is: Patient Location: Home ?I am: Provider Location: Office/Clinic ?Subjective:   ? ?CC and HPI: Shirley Savage is a 56 y.o. year old female presenting for follow up of diabetes. ? ?She has been on [facility] for 4 weeks.  ?She has noticed a difference in her appetite.  ?She noticed a little bit of transient nausea/ickyness right the day after the first dose, but that only lasted for a few hours ?She has not had any other side effects ?She has been using the [employer] for her blood glucose readings  ?She has had a couple of low blood glucose readings - but the lowest was 59  ?She ate apples and peanut butter and this did bring her BG up.  ?She is working on finding foods that will keep her BG stable. ?Overall she feels good and would like to continue the medication.  ?She has the new dose from the pharmacy and plans to go up on the dose.  ? ?Past medical history, Surgical history, Family history not pertinant except as noted below, Social history, Allergies, and medications have been entered into the medical record, reviewed, and corrections made.  ? ?Review of Systems:  ?All review of systems negative except what is listed in the HPI ? ?Objective:   ? ?Alert and oriented x 4 ?Speaking in clear sentences with no shortness of breath. ?No distress. ? ?Impression and Recommendations:   ? ?Problem List Items Addressed This Visit   ? ? Diabetes  mellitus ([place]) - Primary  ?  Chronic. Doing well on Mounjaro and FreeStyle.  ?Managing BG effectively. Diet and exercise are appropriate ?Educational resources provided on AVS for carbohydrate counting and recommendations.  ?Increase Mounjaro to '5mg'$  dose tonight and monitor for SE.  ?F/U in 3 months for A1c and BP/DM monitoring ? ?  ?  ? Morbid obesity ([place])  ? Family history of thyroid disease  ? History of hyperlipidemia, mixed  ? ?Other Visit Diagnoses   ? ? Healthcare maintenance      ? ?  ? ? ?current treatment plan is effective, no change in therapy ?I discussed the assessment and treatment plan with the patient. The patient was provided an opportunity to ask questions and all were answered. The patient agreed with the plan and demonstrated an understanding of the instructions. ?  ?The patient was advised to call back or seek an in-person evaluation if the symptoms worsen or if the condition fails to improve as anticipated. ? ?Follow-Up: in 3 months ? ?I provided 25 minutes of non-face-to-face interaction with this non face-to-face encounter including intake, same-day documentation, and chart review.  ? ?Orma Render, NP , DNP, AGNP-c ?[facility] ?Primary Care & Sports Medicine at [facility] ?[phone] ?[phone] (fax) ? ?

## 2021-07-25 NOTE — Assessment & Plan Note (Addendum)
Chronic. Doing well on Mounjaro and FreeStyle.  ?Managing BG effectively. Diet and exercise are appropriate ?Educational resources provided on AVS for carbohydrate counting and recommendations.  ?Increase Mounjaro to '5mg'$  dose tonight and monitor for SE.  ?F/U in 3 months for A1c and BP/DM monitoring ?

## 2021-08-18 ENCOUNTER — Encounter (HOSPITAL_BASED_OUTPATIENT_CLINIC_OR_DEPARTMENT_OTHER): Payer: Self-pay | Admitting: Nurse Practitioner

## 2021-08-19 ENCOUNTER — Other Ambulatory Visit (HOSPITAL_BASED_OUTPATIENT_CLINIC_OR_DEPARTMENT_OTHER): Payer: Self-pay

## 2021-08-19 DIAGNOSIS — E1165 Type 2 diabetes mellitus with hyperglycemia: Secondary | ICD-10-CM

## 2021-08-19 MED ORDER — TIRZEPATIDE 7.5 MG/0.5ML ~~LOC~~ SOAJ: 7.5000 mg | SUBCUTANEOUS | 1 refills | Status: DC

## 2021-09-18 ENCOUNTER — Encounter (HOSPITAL_BASED_OUTPATIENT_CLINIC_OR_DEPARTMENT_OTHER): Payer: Self-pay | Admitting: Nurse Practitioner

## 2021-10-01 ENCOUNTER — Encounter (HOSPITAL_BASED_OUTPATIENT_CLINIC_OR_DEPARTMENT_OTHER): Payer: Self-pay | Admitting: Nurse Practitioner

## 2021-10-03 ENCOUNTER — Other Ambulatory Visit (HOSPITAL_BASED_OUTPATIENT_CLINIC_OR_DEPARTMENT_OTHER): Payer: Self-pay | Admitting: Nurse Practitioner

## 2021-10-03 DIAGNOSIS — E559 Vitamin D deficiency, unspecified: Secondary | ICD-10-CM

## 2021-11-10 ENCOUNTER — Encounter (HOSPITAL_BASED_OUTPATIENT_CLINIC_OR_DEPARTMENT_OTHER): Payer: Self-pay | Admitting: Nurse Practitioner

## 2021-11-12 ENCOUNTER — Encounter (HOSPITAL_BASED_OUTPATIENT_CLINIC_OR_DEPARTMENT_OTHER): Payer: Self-pay | Admitting: Nurse Practitioner

## 2021-11-12 DIAGNOSIS — E1165 Type 2 diabetes mellitus with hyperglycemia: Secondary | ICD-10-CM

## 2021-11-14 ENCOUNTER — Other Ambulatory Visit (HOSPITAL_BASED_OUTPATIENT_CLINIC_OR_DEPARTMENT_OTHER): Payer: Self-pay | Admitting: Nurse Practitioner

## 2021-11-14 DIAGNOSIS — Z8639 Personal history of other endocrine, nutritional and metabolic disease: Secondary | ICD-10-CM

## 2021-11-14 DIAGNOSIS — E1165 Type 2 diabetes mellitus with hyperglycemia: Secondary | ICD-10-CM

## 2021-11-14 DIAGNOSIS — K439 Ventral hernia without obstruction or gangrene: Secondary | ICD-10-CM

## 2021-11-14 DIAGNOSIS — E785 Hyperlipidemia, unspecified: Secondary | ICD-10-CM

## 2021-11-14 MED ORDER — TIRZEPATIDE 7.5 MG/0.5ML ~~LOC~~ SOAJ: 7.5000 mg | SUBCUTANEOUS | 3 refills | Status: DC

## 2021-11-14 MED ORDER — TIRZEPATIDE 10 MG/0.5ML ~~LOC~~ SOAJ: 10.0000 mg | SUBCUTANEOUS | 3 refills | Status: DC

## 2022-01-01 ENCOUNTER — Encounter (HOSPITAL_BASED_OUTPATIENT_CLINIC_OR_DEPARTMENT_OTHER): Payer: Self-pay | Admitting: Nurse Practitioner

## 2022-01-01 ENCOUNTER — Ambulatory Visit (INDEPENDENT_AMBULATORY_CARE_PROVIDER_SITE_OTHER): Payer: BC Managed Care – PPO | Admitting: Nurse Practitioner

## 2022-01-01 VITALS — BP 134/88 | HR 70 | Ht 62.0 in | Wt 210.0 lb

## 2022-01-01 DIAGNOSIS — D500 Iron deficiency anemia secondary to blood loss (chronic): Secondary | ICD-10-CM | POA: Diagnosis not present

## 2022-01-01 DIAGNOSIS — E1165 Type 2 diabetes mellitus with hyperglycemia: Secondary | ICD-10-CM | POA: Diagnosis not present

## 2022-01-01 DIAGNOSIS — Z8639 Personal history of other endocrine, nutritional and metabolic disease: Secondary | ICD-10-CM

## 2022-01-01 DIAGNOSIS — K439 Ventral hernia without obstruction or gangrene: Secondary | ICD-10-CM

## 2022-01-01 DIAGNOSIS — E1169 Type 2 diabetes mellitus with other specified complication: Secondary | ICD-10-CM

## 2022-01-01 MED ORDER — TIRZEPATIDE 2.5 MG/0.5ML ~~LOC~~ SOAJ: 2.5000 mg | SUBCUTANEOUS | 0 refills | Status: DC

## 2022-01-01 MED ORDER — FREESTYLE LIBRE 2 SENSOR MISC: 12 refills | Status: AC

## 2022-01-01 NOTE — Assessment & Plan Note (Signed)
Chronic. Working on diet and exercise for weight management. Restart Mounjaro on Monday when insurance picks up. Labs pending.

## 2022-01-01 NOTE — Assessment & Plan Note (Signed)
Surgery on hold until weight loss and A1c control achieved. Labs pending. She has lost a considerable amount of weight and the herniation has decreased. Will continue to monitor.

## 2022-01-01 NOTE — Assessment & Plan Note (Signed)
Labs pending.  

## 2022-01-01 NOTE — Assessment & Plan Note (Signed)
Labs pending. Hysterectomy completed earlier this year. Expect improvement from previous levels. No sx

## 2022-01-01 NOTE — Progress Notes (Signed)
Worthy Keeler, DNP, AGNP-c [facility] [address] [place], [place] [address] [phone] Office [phone] Fax  ESTABLISHED PATIENT- Chronic Health and/or Follow-Up Visit  Blood pressure 134/88, pulse 70, height '5\' 2"'$  (1.575 m), weight 210 lb (95.3 kg), last menstrual period 08/04/2016, SpO2 99 %.    Shirley Savage is a 56 y.o. year old female presenting today for evaluation and management of the following: Follow-up (Patient presents today for follow up. Patient is feeling good. )   DM She was previously on [place]- lost insurance and has been out of medication since August.  She has been cutting out her sugar and has used agave instead She has not been able to get her test strips due to loss of insurance She does have a freestyle on and is monitoring her BG at this time   Hernia Hernia on right side in mid abdomen.  Was told by surgery that she would have to loose weight before they could do repair.  She endorses working in this, but with weight loss she has seen it decrease in size and no longer hurts  Hx of Hysterectomy- ovaries remain  All ROS negative with exception of what is listed above.   PHYSICAL EXAM Physical Exam Vitals and nursing note reviewed.  Constitutional:      General: She is not in acute distress.    Appearance: Normal appearance. She is obese.  HENT:     Head: Normocephalic.  Eyes:     Extraocular Movements: Extraocular movements intact.     Conjunctiva/sclera: Conjunctivae normal.     Pupils: Pupils are equal, round, and reactive to light.  Neck:     Vascular: No carotid bruit.  Cardiovascular:     Rate and Rhythm: Normal rate and regular rhythm.     Pulses: Normal pulses.     Heart sounds: Normal heart sounds. No murmur heard. Pulmonary:     Effort: Pulmonary effort is normal.     Breath sounds: Normal breath sounds. No wheezing.  Abdominal:     General: Bowel sounds are normal. There  is no distension.     Palpations: Abdomen is soft.     Tenderness: There is no abdominal tenderness. There is no guarding.  Musculoskeletal:        General: Normal range of motion.     Cervical back: Normal range of motion and neck supple.     Right lower leg: No edema.     Left lower leg: No edema.  Lymphadenopathy:     Cervical: No cervical adenopathy.  Skin:    General: Skin is warm and dry.     Capillary Refill: Capillary refill takes less than 2 seconds.  Neurological:     General: No focal deficit present.     Mental Status: She is alert and oriented to person, place, and time.  Psychiatric:        Mood and Affect: Mood normal.        Behavior: Behavior normal.        Thought Content: Thought content normal.        Judgment: Judgment normal.     PLAN Problem List Items Addressed This Visit     Morbid obesity ([place])    Chronic. Working on diet and exercise for weight management. Restart Mounjaro on Monday when insurance picks up. Labs pending.       Relevant Medications   tirzepatide (MOUNJARO) 2.5 MG/0.5ML Pen   Continuous Blood Gluc  Sensor (FREESTYLE LIBRE 2 SENSOR) MISC   Other Relevant Orders   CBC With Diff/Platelet   Comprehensive metabolic panel   Hemoglobin A1c   Lipid panel   Hemoglobin A1c   Lipid panel   CBC   Basic metabolic panel   Hepatic function panel   Microalbumin / creatinine urine ratio   Diabetes mellitus (HCC) - Primary    Chronic. Most recent A1c in February with [facility] at 9.1%. At this time patient has not been on her medication for about 2 months due to loss of insurance. We will plan for labs today. No alarm sx present at this time. Expect that A1c will be improved given her recent weight loss. Will plan to restart mounjaro on Monday when her new insurance starts. Rx sent today. Labs pending F/u in 3 months      Relevant Medications   tirzepatide (MOUNJARO) 2.5 MG/0.5ML Pen   Continuous Blood Gluc Sensor (FREESTYLE  LIBRE 2 SENSOR) MISC   Other Relevant Orders   CBC With Diff/Platelet   Comprehensive metabolic panel   Hemoglobin A1c   Lipid panel   POCT Urine microalbumin-creatinine with uACR   Hemoglobin A1c   Lipid panel   CBC   Basic metabolic panel   Hepatic function panel   Microalbumin / creatinine urine ratio   Hemoglobin A1c   Lipid panel   CBC   Basic metabolic panel   Hepatic function panel   Microalbumin / creatinine urine ratio   History of hyperlipidemia, mixed    Labs pending.       Relevant Medications   tirzepatide (MOUNJARO) 2.5 MG/0.5ML Pen   Continuous Blood Gluc Sensor (FREESTYLE LIBRE 2 SENSOR) MISC   Other Relevant Orders   CBC With Diff/Platelet   Comprehensive metabolic panel   Hemoglobin A1c   Lipid panel   Hemoglobin A1c   Lipid panel   CBC   Basic metabolic panel   Hepatic function panel   Microalbumin / creatinine urine ratio   Iron deficiency anemia due to chronic blood loss    Labs pending. Hysterectomy completed earlier this year. Expect improvement from previous levels. No sx       Relevant Orders   CBC With Diff/Platelet   Comprehensive metabolic panel   Hemoglobin A1c   Lipid panel   Hemoglobin A1c   Lipid panel   CBC   Basic metabolic panel   Hepatic function panel   Microalbumin / creatinine urine ratio   Ventral hernia without obstruction or gangrene    Surgery on hold until weight loss and A1c control achieved. Labs pending. She has lost a considerable amount of weight and the herniation has decreased. Will continue to monitor.       Relevant Orders   Hemoglobin A1c   Lipid panel   CBC   Basic metabolic panel   Hepatic function panel   Microalbumin / creatinine urine ratio    Return in about 3 months (around 04/02/2022) for DM, Wt, HLD.   Worthy Keeler, DNP, AGNP-c 01/01/2022  9:08 AM

## 2022-01-01 NOTE — Patient Instructions (Addendum)
It was a pleasure seeing you today. I hope your time spent with [place] was pleasant and helpful. Please let us know if there is anything we can do to improve the service you receive.   We will check your labs today and see where things are. We will get you back on the [facility] and see how things go for you. I will send this is in, but if they do not have it, then let me know and we can change it to our pharmacy downstairs.   Keep an eye on your BP and let me know if it is staying over 130/80.    Important Office Information Lab Results If labs were ordered, please note that you will see results through [facility] as soon as they come available from [facility].  It takes up to 5 business days for the results to be routed to me and for me to review them once all of the lab results have come through from [facility]. I will make recommendations based on your results and send these through [place] or someone from the office will call you to discuss. If your labs are abnormal, we may contact you to schedule a visit to discuss the results and make recommendations.  If you have not heard from [place] within 5 business days or you have waited longer than a week and your lab results have not come through on [place], please feel free to call the office or send a message through [place] to follow-up on these labs.   Referrals If referrals were placed today, the office where the referral was sent will contact you either by phone or through [place] to set up scheduling. Please note that it can take up to a week for the referral office to contact you. If you do not hear from them in a week, please contact the referral office directly to inquire about scheduling.   Condition Treated If your condition worsens or you begin to have new symptoms, please schedule a follow-up appointment for further evaluation. If you are not sure if an appointment is needed, you may call the office to leave a message for the nurse and someone will  contact you with recommendations.  If you have an urgent or life threatening emergency, please do not call the office, but seek emergency evaluation by calling 911 or going to the nearest emergency room for evaluation.   MyChart and Phone Calls Please do not use MyChart for urgent messages. It may take up to 3 business days for MyChart messages to be read by staff and if they are unable to handle the request, an additional 3 business days for them to be routed to me and for my response.  Messages sent to the provider through [place] do not come directly to the provider, please allow time for these messages to be routed and for me to respond.  We get a large volume of MyChart messages daily and these are responded to in the order received.   For urgent messages, please call the office at [phone] and speak with the front office staff or leave a message on the line of my assistant for guidance.  We are seeing patients from the hours of 8:00 am through 5:00 pm and calls directly to the nurse may not be answered immediately due to seeing patients, but your call will be returned as soon as possible.  Phone  messages received after 4:00 PM Monday through Thursday may not be returned until the following  business day. Phone messages received after 11:00 AM on Friday may not be returned until Monday.   After Hours We share on call hours with providers from other offices. If you have an urgent need after hours that cannot wait until the next business day, please contact the on call provider by calling the office number. A nurse will speak with you and contact the provider if needed for recommendations.  If you have an urgent or life threatening emergency after hours, please do not call the on call provider, but seek emergency evaluation by calling 911 or going to the nearest emergency room for evaluation.   Paperwork All paperwork requires a minimum of 5 days to complete and return to you or the  designated personnel. Please keep this in mind when bringing in forms or sending requests for paperwork completion to the office.

## 2022-01-01 NOTE — Assessment & Plan Note (Signed)
Chronic. Most recent A1c in February with [facility] at 9.1%. At this time patient has not been on her medication for about 2 months due to loss of insurance. We will plan for labs today. No alarm sx present at this time. Expect that A1c will be improved given her recent weight loss. Will plan to restart mounjaro on Monday when her new insurance starts. Rx sent today. Labs pending F/u in 3 months

## 2022-01-02 LAB — CBC WITH DIFF/PLATELET
Basophils Absolute: 0 10*3/uL (ref 0.0–0.2)
Basos: 1 %
EOS (ABSOLUTE): 0.1 10*3/uL (ref 0.0–0.4)
Eos: 2 %
Hematocrit: 37.3 % (ref 34.0–46.6)
Hemoglobin: 12.4 g/dL (ref 11.1–15.9)
Immature Grans (Abs): 0 10*3/uL (ref 0.0–0.1)
Immature Granulocytes: 0 %
Lymphocytes Absolute: 2.1 10*3/uL (ref 0.7–3.1)
Lymphs: 35 %
MCH: 29.4 pg (ref 26.6–33.0)
MCHC: 33.2 g/dL (ref 31.5–35.7)
MCV: 88 fL (ref 79–97)
Monocytes Absolute: 0.3 10*3/uL (ref 0.1–0.9)
Monocytes: 5 %
Neutrophils Absolute: 3.5 10*3/uL (ref 1.4–7.0)
Neutrophils: 57 %
Platelets: 229 10*3/uL (ref 150–450)
RBC: 4.22 x10E6/uL (ref 3.77–5.28)
RDW: 14.2 % (ref 11.7–15.4)
WBC: 6.1 10*3/uL (ref 3.4–10.8)

## 2022-01-02 LAB — COMPREHENSIVE METABOLIC PANEL
ALT: 15 IU/L (ref 0–32)
AST: 14 IU/L (ref 0–40)
Albumin/Globulin Ratio: 2 (ref 1.2–2.2)
Albumin: 4.7 g/dL (ref 3.8–4.9)
Alkaline Phosphatase: 85 IU/L (ref 44–121)
BUN/Creatinine Ratio: 18 (ref 9–23)
BUN: 12 mg/dL (ref 6–24)
Bilirubin Total: 0.5 mg/dL (ref 0.0–1.2)
CO2: 21 mmol/L (ref 20–29)
Calcium: 10 mg/dL (ref 8.7–10.2)
Chloride: 104 mmol/L (ref 96–106)
Creatinine, Ser: 0.68 mg/dL (ref 0.57–1.00)
Globulin, Total: 2.3 g/dL (ref 1.5–4.5)
Glucose: 107 mg/dL — ABNORMAL HIGH (ref 70–99)
Potassium: 4.7 mmol/L (ref 3.5–5.2)
Sodium: 142 mmol/L (ref 134–144)
Total Protein: 7 g/dL (ref 6.0–8.5)
eGFR: 103 mL/min/{1.73_m2} (ref >59)

## 2022-01-02 LAB — LIPID PANEL
Chol/HDL Ratio: 2.7 ratio (ref 0.0–4.4)
Cholesterol, Total: 160 mg/dL (ref 100–199)
HDL: 59 mg/dL (ref >39)
LDL Chol Calc (NIH): 83 mg/dL (ref 0–99)
Triglycerides: 96 mg/dL (ref 0–149)
VLDL Cholesterol Cal: 18 mg/dL (ref 5–40)

## 2022-01-02 LAB — HEMOGLOBIN A1C
Est. average glucose Bld gHb Est-mCnc: 120 mg/dL
Hgb A1c MFr Bld: 5.8 % — ABNORMAL HIGH (ref 4.8–5.6)

## 2022-01-07 ENCOUNTER — Encounter (HOSPITAL_BASED_OUTPATIENT_CLINIC_OR_DEPARTMENT_OTHER): Payer: Self-pay | Admitting: Nurse Practitioner

## 2022-01-08 ENCOUNTER — Other Ambulatory Visit (HOSPITAL_BASED_OUTPATIENT_CLINIC_OR_DEPARTMENT_OTHER): Payer: Self-pay

## 2022-01-08 ENCOUNTER — Other Ambulatory Visit (HOSPITAL_BASED_OUTPATIENT_CLINIC_OR_DEPARTMENT_OTHER): Payer: Self-pay | Admitting: Nurse Practitioner

## 2022-01-08 DIAGNOSIS — Z8639 Personal history of other endocrine, nutritional and metabolic disease: Secondary | ICD-10-CM

## 2022-01-08 DIAGNOSIS — E1165 Type 2 diabetes mellitus with hyperglycemia: Secondary | ICD-10-CM

## 2022-01-08 MED ORDER — TIRZEPATIDE 2.5 MG/0.5ML ~~LOC~~ SOAJ
2.5000 mg | SUBCUTANEOUS | 0 refills | Status: DC
  Filled 2022-01-08: qty 2, 28d supply, fill #0

## 2022-01-08 NOTE — Telephone Encounter (Signed)
OK to send [facility] '5mg'$  if she prefers that. To [facility]

## 2022-01-09 ENCOUNTER — Other Ambulatory Visit (HOSPITAL_BASED_OUTPATIENT_CLINIC_OR_DEPARTMENT_OTHER): Payer: Self-pay | Admitting: Nurse Practitioner

## 2022-01-09 ENCOUNTER — Other Ambulatory Visit (HOSPITAL_BASED_OUTPATIENT_CLINIC_OR_DEPARTMENT_OTHER): Payer: Self-pay

## 2022-01-09 DIAGNOSIS — E559 Vitamin D deficiency, unspecified: Secondary | ICD-10-CM

## 2022-01-09 MED ORDER — TIRZEPATIDE 5 MG/0.5ML ~~LOC~~ SOAJ
5.0000 mg | SUBCUTANEOUS | 3 refills | Status: DC
  Filled 2022-01-09: qty 4, 56d supply, fill #0

## 2022-01-17 ENCOUNTER — Other Ambulatory Visit (HOSPITAL_BASED_OUTPATIENT_CLINIC_OR_DEPARTMENT_OTHER): Payer: Self-pay

## 2022-01-21 ENCOUNTER — Other Ambulatory Visit (HOSPITAL_BASED_OUTPATIENT_CLINIC_OR_DEPARTMENT_OTHER): Payer: Self-pay

## 2022-01-26 ENCOUNTER — Other Ambulatory Visit (HOSPITAL_BASED_OUTPATIENT_CLINIC_OR_DEPARTMENT_OTHER): Payer: Self-pay

## 2022-01-31 ENCOUNTER — Other Ambulatory Visit (HOSPITAL_BASED_OUTPATIENT_CLINIC_OR_DEPARTMENT_OTHER): Payer: Self-pay

## 2022-01-31 ENCOUNTER — Encounter (HOSPITAL_BASED_OUTPATIENT_CLINIC_OR_DEPARTMENT_OTHER): Payer: Self-pay | Admitting: Pharmacist

## 2022-02-05 ENCOUNTER — Encounter (HOSPITAL_BASED_OUTPATIENT_CLINIC_OR_DEPARTMENT_OTHER): Payer: Self-pay | Admitting: Nurse Practitioner

## 2022-02-05 DIAGNOSIS — E1165 Type 2 diabetes mellitus with hyperglycemia: Secondary | ICD-10-CM

## 2022-02-05 DIAGNOSIS — K439 Ventral hernia without obstruction or gangrene: Secondary | ICD-10-CM

## 2022-02-05 DIAGNOSIS — M1711 Unilateral primary osteoarthritis, right knee: Secondary | ICD-10-CM

## 2022-02-05 DIAGNOSIS — Z8639 Personal history of other endocrine, nutritional and metabolic disease: Secondary | ICD-10-CM

## 2022-02-10 MED ORDER — PHENTERMINE HCL 37.5 MG PO TABS: 37.5000 mg | ORAL_TABLET | Freq: Every day | ORAL | 2 refills | Status: DC

## 2022-02-10 NOTE — Telephone Encounter (Signed)
Please check on the prior authorization information provided.  Sure that it shows that the patient has tried and failed metformin and phentermine.  Patient was on metformin for greater than a year and had significant side effects that required her to stop the medication.  If there are additional suggestions or recommendations for other medications that they would like [place] to try first please let me know and we can determine if these are appropriate options.  Thank you so much!

## 2022-02-12 ENCOUNTER — Encounter (HOSPITAL_BASED_OUTPATIENT_CLINIC_OR_DEPARTMENT_OTHER): Payer: Self-pay | Admitting: Nurse Practitioner

## 2022-02-12 ENCOUNTER — Ambulatory Visit (INDEPENDENT_AMBULATORY_CARE_PROVIDER_SITE_OTHER): Payer: Commercial Managed Care - HMO | Admitting: Nurse Practitioner

## 2022-02-12 VITALS — BP 129/81 | HR 68 | Ht 62.0 in | Wt 212.0 lb

## 2022-02-12 DIAGNOSIS — N200 Calculus of kidney: Secondary | ICD-10-CM

## 2022-02-12 DIAGNOSIS — R109 Unspecified abdominal pain: Secondary | ICD-10-CM

## 2022-02-12 DIAGNOSIS — N3000 Acute cystitis without hematuria: Secondary | ICD-10-CM | POA: Diagnosis not present

## 2022-02-12 DIAGNOSIS — M2550 Pain in unspecified joint: Secondary | ICD-10-CM | POA: Diagnosis not present

## 2022-02-12 LAB — POCT URINALYSIS DIP (CLINITEK)
Bilirubin, UA: NEGATIVE
Blood, UA: NEGATIVE
Glucose, UA: NEGATIVE mg/dL
Ketones, POC UA: NEGATIVE mg/dL
Leukocytes, UA: NEGATIVE
Nitrite, UA: NEGATIVE
POC PROTEIN,UA: NEGATIVE
Spec Grav, UA: 1.02 (ref 1.010–1.025)
Urobilinogen, UA: 0.2 E.U./dL
pH, UA: 5 (ref 5.0–8.0)

## 2022-02-12 MED ORDER — TAMSULOSIN HCL 0.4 MG PO CAPS: 0.4000 mg | ORAL_CAPSULE | Freq: Every day | ORAL | 3 refills | Status: AC

## 2022-02-12 MED ORDER — NITROFURANTOIN MONOHYD MACRO 100 MG PO CAPS: 100.0000 mg | ORAL_CAPSULE | Freq: Two times a day (BID) | ORAL | 0 refills | Status: DC

## 2022-02-12 MED ORDER — MELOXICAM 15 MG PO TABS: 15.0000 mg | ORAL_TABLET | Freq: Every day | ORAL | 0 refills | Status: DC

## 2022-02-12 NOTE — Patient Instructions (Addendum)
I really think that you have a kidney stone that is moving. I am going to send in medication to help relax the ureters to allow this to pass. If you are still having pain after the weekend, call and let me know and we will plan to get imaging of the area to make sure the stone can pass.

## 2022-02-12 NOTE — Assessment & Plan Note (Signed)
Intermittent, cyclic right-sided flank pain and wavelike nature with varying levels of severity and urinary symptoms consistent with nephrolithiasis.  Discussed symptoms with patient and recommendations.  At this time we cannot trial treatment with Flomax and increased hydration or we can go ahead with imaging.  She would like to trial treatment with medication first and if this is not successful in the next few days then proceed with imaging.  I feel that this is very reasonable and helps to keep healthcare costs lower as well as reduce exposure to radiation.  We will send medication to pharmacy and recommend contacting the office if symptoms persist past the weekend with no change.

## 2022-02-12 NOTE — Assessment & Plan Note (Signed)
Arthralgia of multiple joints with evidence of inflammation present on examination today.  Patient does have strong family history of rheumatoid arthritis therefore I do not feel it is reasonable to monitor labs today.  Encourage frequent movement and stretching exercises to help with pain.  Patient may benefit from topical NSAID as needed.  We will monitor closely.

## 2022-02-12 NOTE — Progress Notes (Signed)
Orma Render, DNP, AGNP-c [facility] [address]  [place], [place] [address] [phone]  Subjective:   Shirley Savage is a 56 y.o. female presents to day for evaluation of: Acute Visit (Pt presents with pain on rt side x 1week)  Right Flank Pain Savreen endorses symptoms of intermittent right sided flank pain for approximately the past week on a consistent basis.  She tells me that in the past she has had pain in this area several times however no one has been able to determine the cause.  She endorses the pain can be a dull colicky type pain for a sharp searing severe pain.  She tells me that the pain appears to come in waves.  She does tell me that she is concerned for possible kidney infection.  She tells me that at times the pain will wrap around to the lower abdomen area on the right side as well.  She endorses subjective fever on and off and instances of nausea due to the pain. Arthralgia Heran reports swelling and tenderness to the joints in her hands at her knuckles as well as her hips and lower back.  She does have a family history significant for rheumatoid arthritis in her mother and she is concerned about the possibility for this.  She tells me at times the joints will feel fevered and the swelling varies in severity. Pain Olivia Mackie endorses symptoms of low back pain in the posterior pelvic region with radiation down the right leg.  She tells me that at times this can be severe in nature and then will relieve.  She would like to know what can be done about this condition.  PMH, Medications, and Allergies reviewed and updated in chart as appropriate.   ROS negative except for what is listed in HPI. Objective:  BP 129/81   Pulse 68   Ht '5\' 2"'$  (1.575 m)   Wt 212 lb (96.2 kg)   LMP 08/04/2016   SpO2 100%   BMI 38.78 kg/m  Physical Exam Vitals and nursing note reviewed.  Constitutional:      Appearance: Normal appearance.   HENT:     Head: Normocephalic.  Eyes:     Extraocular Movements: Extraocular movements intact.     Pupils: Pupils are equal, round, and reactive to light.  Neck:     Vascular: No carotid bruit.  Cardiovascular:     Rate and Rhythm: Normal rate and regular rhythm.     Pulses: Normal pulses.     Heart sounds: Normal heart sounds.  Pulmonary:     Effort: Pulmonary effort is normal.     Breath sounds: Normal breath sounds.  Abdominal:     General: Bowel sounds are normal. There is no distension.     Palpations: Abdomen is soft. There is no mass.     Tenderness: There is no abdominal tenderness. There is right CVA tenderness. There is no left CVA tenderness, guarding or rebound.  Musculoskeletal:        General: Swelling and tenderness present. Normal range of motion.     Cervical back: Normal range of motion. No rigidity or tenderness.     Comments: Swelling noted to the dorsal surface of the right hand along the joint lines with tenderness present on palpation. Tenderness noted to the right mid buttock consistent with piriformis entrapment  Lymphadenopathy:     Cervical: No cervical adenopathy.  Skin:    General: Skin is  warm and dry.     Capillary Refill: Capillary refill takes less than 2 seconds.  Neurological:     General: No focal deficit present.     Mental Status: She is alert and oriented to person, place, and time.     Motor: No weakness.     Gait: Gait normal.  Psychiatric:        Mood and Affect: Mood normal.        Behavior: Behavior normal.        Thought Content: Thought content normal.        Judgment: Judgment normal.           Assessment & Plan:   Problem List Items Addressed This Visit     Kidney stone on right side - Primary    Intermittent, cyclic right-sided flank pain and wavelike nature with varying levels of severity and urinary symptoms consistent with nephrolithiasis.  Discussed symptoms with patient and recommendations.  At this time we  cannot trial treatment with Flomax and increased hydration or we can go ahead with imaging.  She would like to trial treatment with medication first and if this is not successful in the next few days then proceed with imaging.  I feel that this is very reasonable and helps to keep healthcare costs lower as well as reduce exposure to radiation.  We will send medication to pharmacy and recommend contacting the office if symptoms persist past the weekend with no change.      Arthralgia    Arthralgia of multiple joints with evidence of inflammation present on examination today.  Patient does have strong family history of rheumatoid arthritis therefore I do not feel it is reasonable to monitor labs today.  Encourage frequent movement and stretching exercises to help with pain.  Patient may benefit from topical NSAID as needed.  We will monitor closely.      Relevant Orders   Rheumatoid factor   Comprehensive metabolic panel   Other Visit Diagnoses     Acute cystitis without hematuria       Relevant Medications   nitrofurantoin, macrocrystal-monohydrate, (MACROBID) 100 MG capsule   Other Relevant Orders   POCT URINALYSIS DIP (CLINITEK) (Completed)   Flank pain, acute       Relevant Medications   tamsulosin (FLOMAX) 0.4 MG CAPS capsule   nitrofurantoin, macrocrystal-monohydrate, (MACROBID) 100 MG capsule   meloxicam (MOBIC) 15 MG tablet         Orma Render, DNP, AGNP-c 02/12/2022  7:59 PM    History, Medications, Surgery, SDOH, and Family History reviewed and updated as appropriate.

## 2022-02-13 LAB — COMPREHENSIVE METABOLIC PANEL
ALT: 18 IU/L (ref 0–32)
AST: 16 IU/L (ref 0–40)
Albumin/Globulin Ratio: 1.7 (ref 1.2–2.2)
Albumin: 4.7 g/dL (ref 3.8–4.9)
Alkaline Phosphatase: 91 IU/L (ref 44–121)
BUN/Creatinine Ratio: 22 (ref 9–23)
BUN: 15 mg/dL (ref 6–24)
Bilirubin Total: 0.5 mg/dL (ref 0.0–1.2)
CO2: 22 mmol/L (ref 20–29)
Calcium: 10 mg/dL (ref 8.7–10.2)
Chloride: 102 mmol/L (ref 96–106)
Creatinine, Ser: 0.68 mg/dL (ref 0.57–1.00)
Globulin, Total: 2.8 g/dL (ref 1.5–4.5)
Glucose: 99 mg/dL (ref 70–99)
Potassium: 4.9 mmol/L (ref 3.5–5.2)
Sodium: 143 mmol/L (ref 134–144)
Total Protein: 7.5 g/dL (ref 6.0–8.5)
eGFR: 103 mL/min/{1.73_m2} (ref >59)

## 2022-02-13 LAB — RHEUMATOID FACTOR: Rheumatoid fact SerPl-aCnc: 10.9 IU/mL (ref <14.0)

## 2022-02-16 ENCOUNTER — Encounter: Payer: Self-pay | Admitting: General Surgery

## 2022-02-16 ENCOUNTER — Other Ambulatory Visit: Payer: Self-pay | Admitting: General Surgery

## 2022-02-16 ENCOUNTER — Ambulatory Visit (HOSPITAL_BASED_OUTPATIENT_CLINIC_OR_DEPARTMENT_OTHER): Payer: Self-pay | Admitting: Nurse Practitioner

## 2022-02-16 DIAGNOSIS — K432 Incisional hernia without obstruction or gangrene: Secondary | ICD-10-CM

## 2022-02-19 ENCOUNTER — Telehealth (HOSPITAL_BASED_OUTPATIENT_CLINIC_OR_DEPARTMENT_OTHER): Payer: Self-pay | Admitting: Nurse Practitioner

## 2022-02-19 NOTE — Telephone Encounter (Signed)
Please call pt regarding phentermine

## 2022-03-12 NOTE — Telephone Encounter (Signed)
Pt has started taking phentermine again after coming off due to dizziness.  She will get her new insurance in Jan and call us for an appt to follow up.

## 2022-03-19 ENCOUNTER — Ambulatory Visit
Admission: RE | Admit: 2022-03-19 | Discharge: 2022-03-19 | Disposition: A | Discharge status: Home / Self Care | Payer: Commercial Managed Care - HMO | Source: Ambulatory Visit | Attending: General Surgery | Admitting: General Surgery

## 2022-03-19 DIAGNOSIS — K432 Incisional hernia without obstruction or gangrene: Secondary | ICD-10-CM

## 2022-03-19 MED ORDER — IOPAMIDOL (ISOVUE-300) INJECTION 61%
100.0000 mL | Freq: Once | INTRAVENOUS | Status: AC | PRN
  Administered 2022-03-19: 100 mL via INTRAVENOUS

## 2022-04-02 ENCOUNTER — Ambulatory Visit (HOSPITAL_BASED_OUTPATIENT_CLINIC_OR_DEPARTMENT_OTHER): Payer: BC Managed Care – PPO | Admitting: Nurse Practitioner

## 2022-04-03 ENCOUNTER — Other Ambulatory Visit (HOSPITAL_BASED_OUTPATIENT_CLINIC_OR_DEPARTMENT_OTHER): Payer: Self-pay | Admitting: Nurse Practitioner

## 2022-04-03 DIAGNOSIS — R109 Unspecified abdominal pain: Secondary | ICD-10-CM

## 2022-04-03 DIAGNOSIS — N3000 Acute cystitis without hematuria: Secondary | ICD-10-CM

## 2022-04-03 MED ORDER — NITROFURANTOIN MONOHYD MACRO 100 MG PO CAPS: 100.0000 mg | ORAL_CAPSULE | Freq: Two times a day (BID) | ORAL | 0 refills | Status: DC

## 2022-04-03 MED ORDER — MELOXICAM 15 MG PO TABS: 15.0000 mg | ORAL_TABLET | Freq: Every day | ORAL | 0 refills | Status: DC

## 2022-04-07 ENCOUNTER — Encounter (HOSPITAL_BASED_OUTPATIENT_CLINIC_OR_DEPARTMENT_OTHER): Payer: Self-pay | Admitting: Family Medicine

## 2022-04-07 ENCOUNTER — Ambulatory Visit (INDEPENDENT_AMBULATORY_CARE_PROVIDER_SITE_OTHER): Payer: Commercial Managed Care - HMO | Admitting: Family Medicine

## 2022-04-07 VITALS — BP 142/91 | HR 67 | Ht 62.0 in | Wt 215.0 lb

## 2022-04-07 DIAGNOSIS — E119 Type 2 diabetes mellitus without complications: Secondary | ICD-10-CM | POA: Diagnosis not present

## 2022-04-07 DIAGNOSIS — Z8349 Family history of other endocrine, nutritional and metabolic diseases: Secondary | ICD-10-CM

## 2022-04-07 DIAGNOSIS — K5730 Diverticulosis of large intestine without perforation or abscess without bleeding: Secondary | ICD-10-CM | POA: Diagnosis not present

## 2022-04-07 DIAGNOSIS — E1169 Type 2 diabetes mellitus with other specified complication: Secondary | ICD-10-CM

## 2022-04-07 DIAGNOSIS — Z0000 Encounter for general adult medical examination without abnormal findings: Secondary | ICD-10-CM

## 2022-04-07 DIAGNOSIS — Z8639 Personal history of other endocrine, nutritional and metabolic disease: Secondary | ICD-10-CM

## 2022-04-07 MED ORDER — ONDANSETRON HCL 8 MG PO TABS: 8.0000 mg | ORAL_TABLET | Freq: Three times a day (TID) | ORAL | 0 refills | Status: AC | PRN

## 2022-04-07 MED ORDER — TRULICITY 0.75 MG/0.5ML ~~LOC~~ SOAJ: 0.7500 mg | SUBCUTANEOUS | 1 refills | Status: DC

## 2022-04-07 NOTE — Progress Notes (Signed)
    Procedures performed today:    None.  Independent interpretation of notes and tests performed by another provider:   None.  Brief History, Exam, Impression, and Recommendations:    BP (!) 142/91 (BP Location: Right Arm, Patient Position: Sitting, Cuff Size: Large)   Pulse 67   Ht '5\' 2"'$  (1.575 m)   Wt 215 lb (97.5 kg)   LMP 08/04/2016   SpO2 100%   BMI 39.32 kg/m   No problem-specific Assessment & Plan notes found for this encounter.  No follow-ups on file.   ___________________________________________ Edda Orea de [place], MD, ABFM, [facility] and [place]

## 2022-04-08 LAB — MICROALBUMIN / CREATININE URINE RATIO
Creatinine, Urine: 57.2 mg/dL
Microalb/Creat Ratio: 21 mg/g creat (ref 0–29)
Microalbumin, Urine: 12.2 ug/mL

## 2022-04-08 NOTE — Assessment & Plan Note (Signed)
Patient had imaging completed a few weeks ago and it was noted that she had evidence of diverticulosis without acute diverticulitis at that time.  She has questions today regarding imaging findings and what these results are indicating We did discuss imaging results today and reviewed observation of diverticulosis noted on CT scan.  Discussed that this could serve as a source to develop diverticulitis in the future, however this may also never come to pass.  Recommend ensuring appropriate dietary adjustments, working on healthy, gradual weight loss, strive to achieve regular bowel movements with avoidance of straining.

## 2022-04-08 NOTE — Assessment & Plan Note (Signed)
Today, patient has questions regarding treatment utilizing GLP-1 receptor agonist in hopes that this can also help with addressing BMI and working towards gradual weight loss.  She had been prescribed Mounjaro previously, however is looking for an alternative related to insurance.  She is not currently utilizing any medications.  Most recent hemoglobin A1c was at goal at 5.8%.  No current issues related to polyuria or polydipsia We discussed options today related to medication management.  Utilizing electronic health record, it does appear that Trulicity is likely the most well covered GLP-1 receptor agonist available to patient.  We discussed this medication as well as others within the category and potential risks and side effects related to these medications including GI upset, nausea, vomiting.  Discussed that typically we start with low-dose and gradually titrate over time as tolerated.  We will plan to proceed with lowest dose of Trulicity initially and follow-up in about a month or so to monitor progress with this.  Did discuss that weight management affects of this class medication will tend to be observed at higher doses of the medications and thus true impact from this may not be appreciated until several months from now.  Encouraged to continue with lifestyle modifications Prior hemoglobin A1c at goal, we will plan to check this around time of next office visit We will check urine microalbumin/creatinine ratio today for screening

## 2022-04-13 ENCOUNTER — Other Ambulatory Visit (HOSPITAL_BASED_OUTPATIENT_CLINIC_OR_DEPARTMENT_OTHER): Payer: Self-pay | Admitting: Family Medicine

## 2022-04-13 DIAGNOSIS — E119 Type 2 diabetes mellitus without complications: Secondary | ICD-10-CM

## 2022-04-16 ENCOUNTER — Ambulatory Visit (HOSPITAL_BASED_OUTPATIENT_CLINIC_OR_DEPARTMENT_OTHER): Payer: Commercial Managed Care - HMO | Admitting: Family Medicine

## 2022-04-30 ENCOUNTER — Other Ambulatory Visit (HOSPITAL_BASED_OUTPATIENT_CLINIC_OR_DEPARTMENT_OTHER): Payer: Self-pay | Admitting: Family Medicine

## 2022-04-30 DIAGNOSIS — R109 Unspecified abdominal pain: Secondary | ICD-10-CM

## 2022-05-11 ENCOUNTER — Ambulatory Visit (HOSPITAL_BASED_OUTPATIENT_CLINIC_OR_DEPARTMENT_OTHER): Payer: Commercial Managed Care - HMO | Admitting: Family Medicine

## 2022-05-18 ENCOUNTER — Other Ambulatory Visit (HOSPITAL_BASED_OUTPATIENT_CLINIC_OR_DEPARTMENT_OTHER): Payer: Self-pay | Admitting: Nurse Practitioner

## 2022-05-18 DIAGNOSIS — E559 Vitamin D deficiency, unspecified: Secondary | ICD-10-CM

## 2022-06-02 ENCOUNTER — Other Ambulatory Visit (HOSPITAL_BASED_OUTPATIENT_CLINIC_OR_DEPARTMENT_OTHER): Payer: Self-pay | Admitting: Nurse Practitioner

## 2022-06-02 DIAGNOSIS — R109 Unspecified abdominal pain: Secondary | ICD-10-CM

## 2022-06-16 ENCOUNTER — Other Ambulatory Visit (HOSPITAL_BASED_OUTPATIENT_CLINIC_OR_DEPARTMENT_OTHER): Payer: Self-pay | Admitting: Nurse Practitioner

## 2022-06-16 ENCOUNTER — Other Ambulatory Visit (HOSPITAL_BASED_OUTPATIENT_CLINIC_OR_DEPARTMENT_OTHER): Payer: Self-pay | Admitting: Family Medicine

## 2022-06-16 DIAGNOSIS — R109 Unspecified abdominal pain: Secondary | ICD-10-CM

## 2022-06-16 DIAGNOSIS — E559 Vitamin D deficiency, unspecified: Secondary | ICD-10-CM

## 2022-06-17 ENCOUNTER — Encounter (HOSPITAL_BASED_OUTPATIENT_CLINIC_OR_DEPARTMENT_OTHER): Payer: Self-pay

## 2022-07-14 ENCOUNTER — Other Ambulatory Visit (HOSPITAL_BASED_OUTPATIENT_CLINIC_OR_DEPARTMENT_OTHER): Payer: Self-pay | Admitting: Family Medicine

## 2022-07-14 ENCOUNTER — Other Ambulatory Visit (HOSPITAL_BASED_OUTPATIENT_CLINIC_OR_DEPARTMENT_OTHER): Payer: Self-pay | Admitting: Nurse Practitioner

## 2022-07-14 DIAGNOSIS — E1169 Type 2 diabetes mellitus with other specified complication: Secondary | ICD-10-CM

## 2022-07-14 DIAGNOSIS — R109 Unspecified abdominal pain: Secondary | ICD-10-CM

## 2022-07-14 DIAGNOSIS — E1165 Type 2 diabetes mellitus with hyperglycemia: Secondary | ICD-10-CM

## 2022-07-20 NOTE — Telephone Encounter (Signed)
Received medication refill request. Review of chart indicates that she has established care with new PCP - Gwynneth Munson, PA-C with [facility]. Patient will need to contact new PCP for refills.

## 2022-10-13 ENCOUNTER — Other Ambulatory Visit (HOSPITAL_BASED_OUTPATIENT_CLINIC_OR_DEPARTMENT_OTHER): Payer: Self-pay | Admitting: Nurse Practitioner

## 2022-10-13 DIAGNOSIS — R109 Unspecified abdominal pain: Secondary | ICD-10-CM

## 2022-11-03 ENCOUNTER — Other Ambulatory Visit (HOSPITAL_BASED_OUTPATIENT_CLINIC_OR_DEPARTMENT_OTHER): Payer: Self-pay | Admitting: Family Medicine

## 2022-11-03 DIAGNOSIS — E119 Type 2 diabetes mellitus without complications: Secondary | ICD-10-CM

## 2022-12-03 ENCOUNTER — Other Ambulatory Visit (HOSPITAL_BASED_OUTPATIENT_CLINIC_OR_DEPARTMENT_OTHER): Payer: Self-pay | Admitting: Family Medicine

## 2022-12-03 DIAGNOSIS — E119 Type 2 diabetes mellitus without complications: Secondary | ICD-10-CM

## 2023-03-09 ENCOUNTER — Other Ambulatory Visit (HOSPITAL_BASED_OUTPATIENT_CLINIC_OR_DEPARTMENT_OTHER): Payer: Self-pay | Admitting: Family Medicine

## 2023-03-09 DIAGNOSIS — R109 Unspecified abdominal pain: Secondary | ICD-10-CM

## 2023-03-10 ENCOUNTER — Other Ambulatory Visit (HOSPITAL_BASED_OUTPATIENT_CLINIC_OR_DEPARTMENT_OTHER): Payer: Self-pay | Admitting: Nurse Practitioner

## 2023-03-10 DIAGNOSIS — Z8639 Personal history of other endocrine, nutritional and metabolic disease: Secondary | ICD-10-CM

## 2023-03-10 DIAGNOSIS — E1169 Type 2 diabetes mellitus with other specified complication: Secondary | ICD-10-CM

## 2023-05-24 ENCOUNTER — Other Ambulatory Visit (HOSPITAL_BASED_OUTPATIENT_CLINIC_OR_DEPARTMENT_OTHER): Payer: Self-pay | Admitting: Nurse Practitioner

## 2023-05-24 DIAGNOSIS — R109 Unspecified abdominal pain: Secondary | ICD-10-CM
